# Patient Record
Sex: Female | Born: 2000 | Race: Black or African American | Hispanic: No | Marital: Single | Smoking: Never smoker
Health system: Southern US, Community
[De-identification: ages and names within clinical notes are randomized; demographics above are authoritative.]

## PROBLEM LIST (undated history)

## (undated) DIAGNOSIS — K219 Gastro-esophageal reflux disease without esophagitis: Secondary | ICD-10-CM

## (undated) DIAGNOSIS — J45909 Unspecified asthma, uncomplicated: Secondary | ICD-10-CM

## (undated) DIAGNOSIS — T7840XA Allergy, unspecified, initial encounter: Secondary | ICD-10-CM

## (undated) HISTORY — DX: Gastro-esophageal reflux disease without esophagitis: K21.9

## (undated) HISTORY — DX: Allergy, unspecified, initial encounter: T78.40XA

## (undated) HISTORY — PX: WISDOM TOOTH EXTRACTION: SHX21

---
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
## 2021-01-05 ENCOUNTER — Other Ambulatory Visit: Payer: No Typology Code available for payment source

## 2021-01-10 ENCOUNTER — Other Ambulatory Visit: Payer: Self-pay

## 2021-01-10 ENCOUNTER — Encounter: Payer: Self-pay | Admitting: Cardiology

## 2021-01-10 ENCOUNTER — Ambulatory Visit (INDEPENDENT_AMBULATORY_CARE_PROVIDER_SITE_OTHER): Payer: No Typology Code available for payment source | Admitting: Cardiology

## 2021-01-10 ENCOUNTER — Ambulatory Visit
Admission: RE | Admit: 2021-01-10 | Discharge: 2021-01-10 | Disposition: A | Discharge status: Home / Self Care | Payer: No Typology Code available for payment source | Source: Ambulatory Visit | Attending: Cardiology | Admitting: Cardiology

## 2021-01-10 VITALS — BP 110/70 | HR 75 | Ht 68.0 in | Wt 243.0 lb

## 2021-01-10 DIAGNOSIS — R002 Palpitations: Secondary | ICD-10-CM

## 2021-01-10 DIAGNOSIS — R0602 Shortness of breath: Secondary | ICD-10-CM

## 2021-01-10 LAB — ECHOCARDIOGRAM COMPLETE
AR max vel: 2 cm2
AV Area VTI: 2.26 cm2
AV Area mean vel: 2 cm2
AV Mean grad: 4 mmHg
AV Peak grad: 7.4 mmHg
Ao pk vel: 1.36 m/s
Area-P 1/2: 4.52 cm2
MV VTI: 2.81 cm2
S' Lateral: 2.2 cm

## 2021-01-10 NOTE — Progress Notes (Signed)
Cardiology Office Note:    Date:  01/10/2021   ID:  Valerie Martinez, DOB 2000/05/19, MRN [id number]  PCP:  Smitty Cords, DO   [facility] Providers Cardiologist:  None     Referring MD: Saralyn Pilar *   Chief Complaint  Patient presents with   OTher    Follow up post ECHO -- Meds reviewed verbally with patient.     History of Present Illness:    Valerie Martinez is a 20 y.o. female with a hx of exercise-induced asthma who presents for follow-up.  Previously seen due to palpitations and shortness of breath.    Shortness of breath usually occurs with exertion.  She also has palpitations typically when she is at school.  She is currently on break, states overall symptoms are better.  She thinks when she graduates from college, her symptoms will be much better.  Has no new concerns at this time, denies chest pain.   Past Medical History:  Diagnosis Date   Allergy    Asthma     Past Surgical History:  Procedure Laterality Date   WISDOM TOOTH EXTRACTION      Current Medications: Current Meds  Medication Sig   albuterol (VENTOLIN HFA) 108 (90 Base) MCG/ACT inhaler 2 puffs Inhalation q 4-6 hours prn cough/wheeze 90 days   Albuterol Sulfate (PROAIR RESPICLICK) 108 (90 Base) MCG/ACT AEPB Inhale 1-2 puffs into the lungs as needed (wheezing, shortness of breath).   cyclobenzaprine (FLEXERIL) 5 MG tablet TAKE 1 TABLET BY MOUTH NIGHTLY AS NEEDED   EPINEPHrine 0.3 mg/0.3 mL IJ SOAJ injection use one auto-injector into outer thigh as needed for  anaphylaxis   fluticasone (FLONASE) 50 MCG/ACT nasal spray Place 2 sprays into both nostrils daily.   fluticasone furoate-vilanterol (BREO ELLIPTA) 100-25 MCG/INH AEPB 1 puff Inhalation Once a day 30 days   ibuprofen (ADVIL,MOTRIN) 800 MG tablet Take 1 tablet (800 mg total) by mouth every 8 (eight) hours as needed.   olopatadine (PATANOL) 0.1 % ophthalmic solution    Olopatadine HCl 0.2 % SOLN place one drop into  affected eye Twice a day   Polyethylene Glycol 3350 (PEG 3350) 17 GM/SCOOP POWD Use as directed, mix in 6-8oz of fluid daily for constipation   rizatriptan (MAXALT) 5 MG tablet Take 1 tablet (5 mg total) by mouth as needed for migraine. May repeat in 2 hours if needed     Allergies:   Peanut oil and Amoxicillin-pot clavulanate   Social History   Socioeconomic History   Marital status: Single    Spouse name: Not on file   Number of children: Not on file   Years of education: Not on file   Highest education level: Not on file  Occupational History   Not on file  Tobacco Use   Smoking status: Never   Smokeless tobacco: Never  Vaping Use   Vaping Use: Never used  Substance and Sexual Activity   Alcohol use: Never   Drug use: Never   Sexual activity: Never    Birth control/protection: None  Other Topics Concern   Not on file  Social History Narrative   Not on file   Social Determinants of Health   Financial Resource Strain: Not on file  Food Insecurity: Not on file  Transportation Needs: Not on file  Physical Activity: Not on file  Stress: Not on file  Social Connections: Not on file     Family History: The patient's family history includes Cancer in her maternal grandmother. There  is no history of Breast cancer, Ovarian cancer, or Colon cancer.  ROS:   Please see the history of present illness.     All other systems reviewed and are negative.  EKGs/Labs/Other Studies Reviewed:    The following studies were reviewed today:   EKG:  EKG not ordered today.   Recent Labs: 11/01/2020: ALT 11; BUN 13; Creat 0.68; Hemoglobin 13.7; Platelets 271; Potassium 4.2; Sodium 137  Recent Lipid Panel    Component Value Date/Time   CHOL 164 11/01/2020 0820   TRIG 33 11/01/2020 0820   HDL 73 11/01/2020 0820   CHOLHDL 2.2 11/01/2020 0820   LDLCALC 82 11/01/2020 0820     Risk Assessment/Calculations:          Physical Exam:    VS:  BP 110/70 (BP Location: Left Arm,  Patient Position: Sitting, Cuff Size: Large)    Pulse 75    Ht 5\' 8"  (1.727 m)    Wt 243 lb (110.2 kg)    SpO2 98%    BMI 36.95 kg/m     Wt Readings from Last 3 Encounters:  01/10/21 243 lb (110.2 kg) (>99 %, Z= 2.43)*  11/29/20 240 lb (108.9 kg) (>99 %, Z= 2.40)*  11/04/20 238 lb 3.2 oz (108 kg) (>99 %, Z= 2.38)*   * Growth percentiles are based on CDC (Girls, 2-20 Years) data.     GEN:  Well nourished, well developed in no acute distress HEENT: Normal NECK: No JVD; No carotid bruits LYMPHATICS: No lymphadenopathy CARDIAC: RRR, no murmurs, rubs, gallops RESPIRATORY:  Clear to auscultation without rales, wheezing or rhonchi  ABDOMEN: Soft, non-tender, non-distended MUSCULOSKELETAL:  No edema; No deformity  SKIN: Warm and dry NEUROLOGIC:  Alert and oriented x 3 PSYCHIATRIC:  Normal affect   ASSESSMENT:    1. Palpitations   2. Shortness of breath     PLAN:    In order of problems listed above:  Palpitations, cardiac monitor did not show any significant arrhythmias, patient triggered events associated with sinus rhythm.  Patient made aware of results, reassured. Shortness of breath.echocardiogram shows normal systolic function, normal diastolic function, EF 60 to 65%.  Symptoms of shortness of breath likely from deconditioning, obesity.    Follow-up as needed      Medication Adjustments/Labs and Tests Ordered: Current medicines are reviewed at length with the patient today.  Concerns regarding medicines are outlined above.  No orders of the defined types were placed in this encounter.   No orders of the defined types were placed in this encounter.    There are no Patient Instructions on file for this visit.   Signed, 11/06/20, MD  01/10/2021 4:26 PM    [facility]

## 2021-01-10 NOTE — Progress Notes (Signed)
*  PRELIMINARY RESULTS* Echocardiogram 2D Echocardiogram has been performed.  Cristela Blue 01/10/2021, 11:41 AM

## 2021-01-10 NOTE — Patient Instructions (Signed)
Medication Instructions:  ? ?Your physician recommends that you continue on your current medications as directed. Please refer to the Current Medication list given to you today. ? ?*If you need a refill on your cardiac medications before your next appointment, please call your pharmacy* ? ? ?Lab Work: ?None ordered ?If you have labs (blood work) drawn today and your tests are completely normal, you will receive your results only by: ?MyChart Message (if you have MyChart) OR ?A paper copy in the mail ?If you have any lab test that is abnormal or we need to change your treatment, we will call you to review the results. ? ? ?Testing/Procedures: ?None ordered ? ? ?Follow-Up: ?At [facility], you and your health needs are our priority.  As part of our continuing mission to provide you with exceptional heart care, we have created designated Provider Care Teams.  These Care Teams include your primary Cardiologist (physician) and Advanced Practice Providers (APPs -  Physician Assistants and Nurse Practitioners) who all work together to provide you with the care you need, when you need it. ? ?We recommend signing up for the patient portal called "MyChart".  Sign up information is provided on this After Visit Summary.  MyChart is used to connect with patients for Virtual Visits (Telemedicine).  Patients are able to view lab/test results, encounter notes, upcoming appointments, etc.  Non-urgent messages can be sent to your provider as well.   ?To learn more about what you can do with MyChart, go to [email or web address].   ? ?Your next appointment:   ?Follow up as needed  ? ?The format for your next appointment:   ?In Person ? ?Provider:   ?You may see Brian Agbor-Etang or one of the following Advanced Practice Providers on your designated Care Team:   ?Christopher Berge, NP ?Ryan Dunn, PA-C ?Cadence Furth, PA-C  ? ? ?Other Instructions ? ? ?

## 2021-01-18 ENCOUNTER — Other Ambulatory Visit: Payer: Self-pay

## 2021-01-18 ENCOUNTER — Encounter: Payer: Self-pay | Admitting: Family Medicine

## 2021-01-18 ENCOUNTER — Ambulatory Visit (INDEPENDENT_AMBULATORY_CARE_PROVIDER_SITE_OTHER): Payer: No Typology Code available for payment source | Admitting: Family Medicine

## 2021-01-18 VITALS — BP 121/59 | HR 81 | Ht 68.0 in | Wt 242.4 lb

## 2021-01-18 DIAGNOSIS — R002 Palpitations: Secondary | ICD-10-CM

## 2021-01-18 DIAGNOSIS — E669 Obesity, unspecified: Secondary | ICD-10-CM

## 2021-01-18 DIAGNOSIS — R635 Abnormal weight gain: Secondary | ICD-10-CM | POA: Diagnosis not present

## 2021-01-18 DIAGNOSIS — R7309 Other abnormal glucose: Secondary | ICD-10-CM | POA: Diagnosis not present

## 2021-01-18 NOTE — Progress Notes (Signed)
Subjective:    Patient ID: Valerie Martinez, female    DOB: [birth year], 20 y.o.   MRN: [id number]  Valerie Martinez is a 20 y.o. female presenting on 01/18/2021 for Obesity   Here with mother, Morrison Old  HPI  Obesity BMI >36 Goal to lose weight Tried dietary regimen to help weight Limited exercise due to issues with palpitations and heart concerns but she has recently completed visited with Cardiologist and had ECHO and testing. Mon-Fri similar schedule with 730 am and done by 11am. She will have difficulty with breakfast consistency and then will try to improve lunch and dinner consistency. She has tried a protein supplement drink but not pleased with it. She does more home cooked meals. She admits difficulty finding protein and some difficulty some non beef options. Still has access to nutritionist at school Will go back to school starting 01/31/21  Fam history Diabetes   Depression screen [facility] 2/9 01/18/2021 11/04/2020 08/31/2020  Decreased Interest 1 1 0  Down, Depressed, Hopeless 1 1 1   PHQ - 2 Score 2 2 1   Altered sleeping 2 3 2   Tired, decreased energy 1 2 1   Change in appetite 3 3 3   Feeling bad or failure about yourself  1 1 1   Trouble concentrating 0 0 1  Moving slowly or fidgety/restless 0 1 0  Suicidal thoughts 0 0 0  PHQ-9 Score 9 12 9   Difficult doing work/chores Very difficult Somewhat difficult Not difficult at all    Social History   Tobacco Use   Smoking status: Never   Smokeless tobacco: Never  Vaping Use   Vaping Use: Never used  Substance Use Topics   Alcohol use: Never   Drug use: Never    Review of Systems Per HPI unless specifically indicated above     Objective:    BP (!) 121/59    Pulse 81    Ht 5\' 8"  (1.727 m)    Wt 242 lb 6.4 oz (110 kg)    LMP 01/04/2021 (Exact Date)    SpO2 100%    BMI 36.86 kg/m   Wt Readings from Last 3 Encounters:  01/18/21 242 lb 6.4 oz (110 kg) (>99 %, Z= 2.42)*  01/10/21 243 lb (110.2 kg) (>99 %, Z= 2.43)*   11/29/20 240 lb (108.9 kg) (>99 %, Z= 2.40)*   * Growth percentiles are based on CDC (Girls, 2-20 Years) data.    Physical Exam Vitals and nursing note reviewed.  Constitutional:      General: She is not in acute distress.    Appearance: Normal appearance. She is well-developed. She is obese. She is not diaphoretic.     Comments: Well-appearing, comfortable, cooperative  HENT:     Head: Normocephalic and atraumatic.  Eyes:     General:        Right eye: No discharge.        Left eye: No discharge.     Conjunctiva/sclera: Conjunctivae normal.  Cardiovascular:     Rate and Rhythm: Normal rate.  Pulmonary:     Effort: Pulmonary effort is normal.  Skin:    General: Skin is warm and dry.     Findings: No erythema or rash.  Neurological:     Mental Status: She is alert and oriented to person, place, and time.  Psychiatric:        Mood and Affect: Mood normal.        Behavior: Behavior normal.        Thought Content:  Thought content normal.     Comments: Well groomed, good eye contact, normal speech and thoughts   Results for orders placed or performed during the hospital encounter of 01/10/21  ECHOCARDIOGRAM COMPLETE  Result Value Ref Range   Ao pk vel 1.36 m/s   AV Area VTI 2.26 cm2   AR max vel 2.00 cm2   AV Mean grad 4.0 mmHg   AV Peak grad 7.4 mmHg   S' Lateral 2.20 cm   AV Area mean vel 2.00 cm2   Area-P 1/2 4.52 cm2   MV VTI 2.81 cm2      Assessment & Plan:   Problem List Items Addressed This Visit   None Visit Diagnoses     Obesity (BMI 35.0-39.9 without comorbidity)    -  Primary   Relevant Orders   Hemoglobin A1c   TSH   T4, free   Abnormal weight gain       Relevant Orders   Hemoglobin A1c   TSH   T4, free   Abnormal glucose       Relevant Orders   Hemoglobin A1c   Intermittent palpitations       Relevant Orders   TSH   T4, free       Obesity BMI >36 Reviewed history with reassuring normal cardiac evaluation Focus on improving  deconditioning physically and improving nutrition to help promote wt loss See AVS for goal setting and diet / exercise regimen advice  Check labs within 1 week for TSH T4 and A1c, she has had CMET CBC Lipid done 2 months ago already, reviewed, slightly elevated glucose 101, otherwise normal cholesterol other labs normal.  If no other cause of wt gain obesity identified, will pursue further lifestyle goal intervention and gradual improve diet / exercise regimen.  Offered [facility] - she declines now, reconsider in future. Consider Nutritionist locally at [facility] lifestyle center as well. Or keep nutrition through her college.  Future if still unsuccessful we can pursue wt loss medication - advised about Bernie Covey and [facility] today. Would consider 3 month trial if indicated in future.  No orders of the defined types were placed in this encounter.   Follow up plan: Return in about 1 week (around 01/25/2021) for 1 week for fasting lab only then return 3-6 months for weight management.  Future labs ordered for within 1 week A1c TSH T4   Saralyn Pilar, DO [facility] 01/18/2021, 4:28 PM

## 2021-01-18 NOTE — Patient Instructions (Addendum)
Thank you for coming to the office today.  Goals: 1. Dietary - consistent breakfast, high protein, low sugar/carb. Can do protein bar, cereal type bar, or yogurt w/ granola 2. Switch potato from snack to hummus w/ veggies or popcorn or grain/wheat chips or pita. 3. Switch potato for vegetable 4. Limit Sodas while at college 5. Increase hydration in general more water 30-60 oz per day 6. Focus on more high protein plant based options as discussed  Week of __________ Goals SUN MON TUE WED THU FRI SAT                              Eat at least 3 meals and 1-2 snacks per day (don't skip breakfast).  Aim for no more than 5 hours between eating. - Tip: If you go >5 hours without eating and become very hungry, your body will supply it's own resources temporarily and you can gain extra weight when you eat.  The 5 Minute Rule of Exercise - Promise yourself to at least do 5 minutes of exercise (make sure you time it), and if at the end of 5 minutes (this is the hardest part of the work-out), if you still feel like you want stop (or not motivated to continue) then allow yourself to stop. Otherwise, more often than not you will feel encouraged that you can continue for a little while longer or even more!   Please schedule a Follow-up Appointment to: Return in about 1 week (around 01/25/2021) for 1 week for fasting lab only then return 3-6 months for weight management.  If you have any other questions or concerns, please feel free to call the office or send a message through MyChart. You may also schedule an earlier appointment if necessary.  Additionally, you may be receiving a survey about your experience at our office within a few days to 1 week by e-mail or mail. We value your feedback.  Saralyn Pilar, DO [facility], [place]

## 2021-01-19 ENCOUNTER — Other Ambulatory Visit (HOSPITAL_COMMUNITY)
Admission: RE | Admit: 2021-01-19 | Discharge: 2021-01-19 | Disposition: A | Discharge status: Home / Self Care | Payer: No Typology Code available for payment source | Source: Ambulatory Visit | Attending: Certified Nurse Midwife | Admitting: Certified Nurse Midwife

## 2021-01-19 ENCOUNTER — Other Ambulatory Visit: Payer: Self-pay

## 2021-01-19 ENCOUNTER — Ambulatory Visit (INDEPENDENT_AMBULATORY_CARE_PROVIDER_SITE_OTHER): Payer: No Typology Code available for payment source | Admitting: Certified Nurse Midwife

## 2021-01-19 ENCOUNTER — Encounter: Payer: Self-pay | Admitting: Certified Nurse Midwife

## 2021-01-19 VITALS — BP 139/79 | HR 84 | Ht 68.0 in | Wt 240.4 lb

## 2021-01-19 DIAGNOSIS — N898 Other specified noninflammatory disorders of vagina: Secondary | ICD-10-CM | POA: Diagnosis present

## 2021-01-19 DIAGNOSIS — R7309 Other abnormal glucose: Secondary | ICD-10-CM

## 2021-01-19 DIAGNOSIS — R635 Abnormal weight gain: Secondary | ICD-10-CM

## 2021-01-19 DIAGNOSIS — E669 Obesity, unspecified: Secondary | ICD-10-CM

## 2021-01-19 DIAGNOSIS — R002 Palpitations: Secondary | ICD-10-CM

## 2021-01-19 NOTE — Patient Instructions (Signed)

## 2021-01-19 NOTE — Progress Notes (Signed)
GYN ENCOUNTER NOTE  Subjective:       Valerie Martinez is a 20 y.o. G0P0000 female is here for gynecologic evaluation of the following issues:  1. Vaginal odor and yellow discharge x 1 wk. She denies itching, burning. States she has had Yeast infections in past. She denies history of any sexual active and possibility of STD.     Gynecologic History Patient's last menstrual period was 01/04/2021 (exact date). Contraception: none, not sexually active Last Pap: n/a.  Last mammogram: n/a.   Obstetric History OB History  Gravida Para Term Preterm AB Living  0 0 0 0 0 0  SAB IAB Ectopic Multiple Live Births  0 0 0 0 0    Past Medical History:  Diagnosis Date   Allergy    Asthma     Past Surgical History:  Procedure Laterality Date   WISDOM TOOTH EXTRACTION      Current Outpatient Medications on File Prior to Visit  Medication Sig Dispense Refill   albuterol (VENTOLIN HFA) 108 (90 Base) MCG/ACT inhaler 2 puffs Inhalation q 4-6 hours prn cough/wheeze 90 days 18 g 0   Albuterol Sulfate (PROAIR RESPICLICK) 123XX123 (90 Base) MCG/ACT AEPB Inhale 1-2 puffs into the lungs as needed (wheezing, shortness of breath). 1 each 5   cyclobenzaprine (FLEXERIL) 5 MG tablet TAKE 1 TABLET BY MOUTH NIGHTLY AS NEEDED 30 tablet 0   EPINEPHrine 0.3 mg/0.3 mL IJ SOAJ injection Inject into the muscle once.     EPINEPHrine 0.3 mg/0.3 mL IJ SOAJ injection use one auto-injector into outer thigh as needed for  anaphylaxis 4 each 1   fluticasone furoate-vilanterol (BREO ELLIPTA) 100-25 MCG/INH AEPB 1 puff Inhalation Once a day 30 days 60 each 5   ibuprofen (ADVIL,MOTRIN) 800 MG tablet Take 1 tablet (800 mg total) by mouth every 8 (eight) hours as needed. 12 tablet 0   olopatadine (PATANOL) 0.1 % ophthalmic solution      Olopatadine HCl 0.2 % SOLN place one drop into affected eye Twice a day 2.5 mL 5   Polyethylene Glycol 3350 (PEG 3350) 17 GM/SCOOP POWD Use as directed, mix in 6-8oz of fluid daily for constipation      rizatriptan (MAXALT) 5 MG tablet Take 1 tablet (5 mg total) by mouth as needed for migraine. May repeat in 2 hours if needed 10 tablet 2   fluticasone (FLONASE) 50 MCG/ACT nasal spray Place 2 sprays into both nostrils daily.     No current facility-administered medications on file prior to visit.    Allergies  Allergen Reactions   Peanut Oil Anaphylaxis   Amoxicillin-Pot Clavulanate Other (See Comments)    Social History   Socioeconomic History   Marital status: Single    Spouse name: Not on file   Number of children: Not on file   Years of education: Not on file   Highest education level: Not on file  Occupational History   Not on file  Tobacco Use   Smoking status: Never   Smokeless tobacco: Never  Vaping Use   Vaping Use: Never used  Substance and Sexual Activity   Alcohol use: Never   Drug use: Never   Sexual activity: Never    Birth control/protection: None  Other Topics Concern   Not on file  Social History Narrative   Not on file   Social Determinants of Health   Financial Resource Strain: Not on file  Food Insecurity: Not on file  Transportation Needs: Not on file  Physical Activity: Not  on file  Stress: Not on file  Social Connections: Not on file  Intimate Partner Violence: Not on file    Family History  Problem Relation Age of Onset   Cancer Maternal Grandmother    Breast cancer Neg Hx    Ovarian cancer Neg Hx    Colon cancer Neg Hx     The following portions of the patient's history were reviewed and updated as appropriate: allergies, current medications, past family history, past medical history, past social history, past surgical history and problem list.  Review of Systems Review of Systems - Negative except as mentioned in HPI Review of Systems - General ROS: negative for - chills, fatigue, fever, hot flashes, malaise or night sweats Hematological and Lymphatic ROS: negative for - bleeding problems or swollen lymph  nodes Gastrointestinal ROS: negative for - abdominal pain, blood in stools, change in bowel habits and nausea/vomiting Musculoskeletal ROS: negative for - joint pain, muscle pain or muscular weakness Genito-Urinary ROS: negative for - change in menstrual cycle, dysmenorrhea, dyspareunia, dysuria,, genital ulcers, hematuria, incontinence, irregular/heavy menses, nocturia or pelvic pain. Positive for vaginal discharge with odor.   Objective:   BP 139/79    Pulse 84    Ht 5\' 8"  (1.727 m)    Wt 240 lb 6.4 oz (109 kg)    LMP 01/04/2021 (Exact Date)    BMI 36.55 kg/m  CONSTITUTIONAL: Well-developed, well-nourished female in no acute distress.  HENT:  Normocephalic, atraumatic.  NECK: Normal range of motion, supple, no masses.  Normal thyroid.  SKIN: Skin is warm and dry. No rash noted. Not diaphoretic. No erythema. No pallor. NEUROLGIC: Alert and oriented to person, place, and time. PSYCHIATRIC: Normal mood and affect. Normal behavior. Normal judgment and thought content. CARDIOVASCULAR:Not Examined RESPIRATORY: Not Examined BREASTS: Not Examined ABDOMEN: Soft, non distended; Non tender.  No Organomegaly. PELVIC:pt not sexually active speculum exam not indicated, swab collected MUSCULOSKELETAL: Normal range of motion. No tenderness.  No cyanosis, clubbing, or edema.     Assessment:   1. Vaginal discharge     Plan:   Discussed possible causes , reviewed self help measures.  Vaginal swab collected. Sample boric acid given with instructions for use. Will follow up with results. Follow up PRN.   01/06/2021, CNM

## 2021-01-20 LAB — HEMOGLOBIN A1C
Hgb A1c MFr Bld: 4.9 % of total Hgb (ref <5.7)
Mean Plasma Glucose: 94 mg/dL
eAG (mmol/L): 5.2 mmol/L

## 2021-01-20 LAB — T4, FREE: Free T4: 1.1 ng/dL (ref 0.8–1.4)

## 2021-01-20 LAB — TSH: TSH: 0.85 mIU/L

## 2021-01-21 ENCOUNTER — Other Ambulatory Visit: Payer: Self-pay

## 2021-01-21 ENCOUNTER — Encounter: Payer: Self-pay | Admitting: Certified Nurse Midwife

## 2021-01-21 ENCOUNTER — Other Ambulatory Visit: Payer: Self-pay | Admitting: Certified Nurse Midwife

## 2021-01-21 LAB — CERVICOVAGINAL ANCILLARY ONLY
Bacterial Vaginitis (gardnerella): NEGATIVE
Candida Glabrata: POSITIVE — AB
Candida Vaginitis: NEGATIVE
Chlamydia: NEGATIVE
Comment: NEGATIVE
Comment: NEGATIVE
Comment: NEGATIVE
Comment: NEGATIVE
Comment: NEGATIVE
Comment: NORMAL
Neisseria Gonorrhea: NEGATIVE
Trichomonas: NEGATIVE

## 2021-01-21 MED ORDER — FLUCONAZOLE 150 MG PO TABS
150.0000 mg | ORAL_TABLET | Freq: Every day | ORAL | 0 refills | Status: DC
  Filled 2021-01-21: qty 1, 1d supply, fill #0

## 2021-01-24 ENCOUNTER — Other Ambulatory Visit: Payer: Self-pay

## 2021-02-21 ENCOUNTER — Other Ambulatory Visit: Payer: Self-pay

## 2021-02-27 ENCOUNTER — Encounter: Payer: Self-pay | Admitting: Family Medicine

## 2021-03-11 ENCOUNTER — Ambulatory Visit (INDEPENDENT_AMBULATORY_CARE_PROVIDER_SITE_OTHER): Payer: No Typology Code available for payment source | Admitting: Family Medicine

## 2021-03-11 ENCOUNTER — Encounter: Payer: Self-pay | Admitting: Family Medicine

## 2021-03-11 ENCOUNTER — Other Ambulatory Visit: Payer: Self-pay

## 2021-03-11 VITALS — BP 119/68 | HR 80 | Ht 69.0 in | Wt 242.8 lb

## 2021-03-11 DIAGNOSIS — K5900 Constipation, unspecified: Secondary | ICD-10-CM

## 2021-03-11 DIAGNOSIS — K582 Mixed irritable bowel syndrome: Secondary | ICD-10-CM | POA: Diagnosis not present

## 2021-03-11 DIAGNOSIS — K599 Functional intestinal disorder, unspecified: Secondary | ICD-10-CM

## 2021-03-11 NOTE — Progress Notes (Signed)
Subjective:    Patient ID: Valerie Martinez, female    DOB: [birth year], 21 y.o.   MRN: [id number]  Valerie Martinez is a 21 y.o. female presenting on 03/11/2021 for Abdominal Pain   HPI  Abdominal Pain, functional Constipation Difficulty to determine the pattern with frequency. Seems to have variable pain in different locations can be RUQ pain and then lower and also Left lower. Also has R sided back symptoms, worse with deep breathing and some movements. Not linked to larger meals, or fried food or greasy things. She had significant digestive issue with eating red meat, describes diarrhea episode below. Admits feeling nausea and weak Admits x 1 episode of BRBPR small amount Admits some BM every other day, sometimes with harder or dry straining BM. Admits occasional episode of diarrhea with warmth and flushing, usually only at night, feels drained.   Depression screen [facility] 2/9 03/11/2021 01/18/2021 11/04/2020  Decreased Interest 3 1 1   Down, Depressed, Hopeless 3 1 1   PHQ - 2 Score 6 2 2   Altered sleeping 3 2 3   Tired, decreased energy 3 1 2   Change in appetite 3 3 3   Feeling bad or failure about yourself  3 1 1   Trouble concentrating 2 0 0  Moving slowly or fidgety/restless 1 0 1  Suicidal thoughts 0 0 0  PHQ-9 Score 21 9 12   Difficult doing work/chores Extremely dIfficult Very difficult Somewhat difficult    Social History   Tobacco Use   Smoking status: Never   Smokeless tobacco: Never  Vaping Use   Vaping Use: Never used  Substance Use Topics   Alcohol use: Never   Drug use: Never    Review of Systems Per HPI unless specifically indicated above     Objective:    BP 119/68    Pulse 80    Ht 5\' 9"  (1.753 m)    Wt 242 lb 12.8 oz (110.1 kg)    SpO2 100%    BMI 35.86 kg/m   Wt Readings from Last 3 Encounters:  03/11/21 242 lb 12.8 oz (110.1 kg) (>99 %, Z= 2.43)*  01/19/21 240 lb 6.4 oz (109 kg) (>99 %, Z= 2.41)*  01/18/21 242 lb 6.4 oz (110 kg) (>99 %, Z= 2.42)*    * Growth percentiles are based on CDC (Girls, 2-20 Years) data.    Physical Exam Vitals and nursing note reviewed.  Constitutional:      General: She is not in acute distress.    Appearance: Normal appearance. She is well-developed. She is not diaphoretic.     Comments: Well-appearing, comfortable, cooperative  HENT:     Head: Normocephalic and atraumatic.  Eyes:     General:        Right eye: No discharge.        Left eye: No discharge.     Conjunctiva/sclera: Conjunctivae normal.  Cardiovascular:     Rate and Rhythm: Normal rate.  Pulmonary:     Effort: Pulmonary effort is normal. No respiratory distress.     Breath sounds: No wheezing, rhonchi or rales.     Comments: Good air movement, no issues with breathing causing pain. Abdominal:     General: Bowel sounds are decreased. There is no distension.     Palpations: Abdomen is soft.     Tenderness: There is no abdominal tenderness. There is no guarding or rebound. Negative signs include Murphy's sign.     Comments: Localized area but non tender now, RUQ under rib cage and  LUQ occasionally not today  Skin:    General: Skin is warm and dry.     Findings: No erythema or rash.  Neurological:     Mental Status: She is alert and oriented to person, place, and time.  Psychiatric:        Mood and Affect: Mood normal.        Behavior: Behavior normal.        Thought Content: Thought content normal.     Comments: Well groomed, good eye contact, normal speech and thoughts   Results for orders placed or performed in visit on 01/19/21  Cervicovaginal ancillary only  Result Value Ref Range   Neisseria Gonorrhea Negative    Chlamydia Negative    Trichomonas Negative    Bacterial Vaginitis (gardnerella) Negative    Candida Vaginitis Negative    Candida Glabrata Positive (A)    Comment      Normal Reference Range Bacterial Vaginosis - Negative   Comment Normal Reference Range Candida Species - Negative    Comment Normal Reference  Range Candida Galbrata - Negative    Comment Normal Reference Range Trichomonas - Negative    Comment Normal Reference Ranger Chlamydia - Negative    Comment      Normal Reference Range Neisseria Gonorrhea - Negative      Assessment & Plan:   Problem List Items Addressed This Visit   None Visit Diagnoses     Constipation, unspecified constipation type    -  Primary   Irritable bowel syndrome with both constipation and diarrhea       Functional bowel disease           Clinically with some constellation of functional GI symptoms with mixed IBS constipation primary symptoms, some episodic diarrhea / loose stool seems to be provoked triggered by diet - seems red meat and other potential food triggers for her.  X 1 episode small amount BRBPR, can be assoc w/ constipation  Has mixture of upper GI symptoms assoc with these bowel changes  Not endorsing significant persistent abdominal pain or other high risk GI features  Symptoms not consistent with RUQ Biliary colick. No urinary symptoms Not triggered by meals such as GERD or PUD, less likely GB  Trial on miralax course to resolve or maintain the constipation, and hopefully some functional Gi will improve. Counseling on improving diet avoid triggers, limit red meat  May consider Dicyclomine PRN, she declines today.  Future consider GI consultation if unresolved.   No orders of the defined types were placed in this encounter.     Follow up plan: Return if symptoms worsen or fail to improve.   Saralyn Pilar, DO [facility] 03/11/2021, 11:12 AM

## 2021-03-11 NOTE — Patient Instructions (Addendum)
Thank you for coming to the office today.  Likely a functional digestive problem with certain food triggers especially red meat. So try to avoid and eliminate certain food triggers for now. To see if helps  For Constipation (less frequent bowel movement that can be hard dry or involve straining).  Recommend trying OTC Miralax 17g = 1 capful in large glass water once daily for now, try several days to see if working, goal is soft stool or BM 1-2 times daily, if too loose then reduce dose or try every other day. If not effective may need to increase it to 2 doses at once in AM or may do 1 in morning and 1 in afternoon/evening  - This medicine is very safe and can be used often without any problem and will not make you dehydrated. It is good for use on AS NEEDED BASIS or even MAINTENANCE therapy for longer term for several days to weeks at a time to help regulate bowel movements  Other more natural remedies or preventative treatment: - Increase hydration with water - Increase fiber in diet (high fiber foods = vegetables, leafy greens, oats/grains) - May take OTC Fiber supplement (metamucil powder or pill/gummy) - May try OTC Probiotic  If not improving would recommend consultation with GI specialist in future.   Please schedule a Follow-up Appointment to: Return if symptoms worsen or fail to improve.  If you have any other questions or concerns, please feel free to call the office or send a message through MyChart. You may also schedule an earlier appointment if necessary.  Additionally, you may be receiving a survey about your experience at our office within a few days to 1 week by e-mail or mail. We value your feedback.  Saralyn Pilar, DO [facility], [place]

## 2021-03-14 ENCOUNTER — Other Ambulatory Visit: Payer: Self-pay

## 2021-05-11 ENCOUNTER — Encounter: Payer: Self-pay | Admitting: Family Medicine

## 2021-07-15 ENCOUNTER — Ambulatory Visit (INDEPENDENT_AMBULATORY_CARE_PROVIDER_SITE_OTHER): Payer: No Typology Code available for payment source | Admitting: Internal Medicine

## 2021-07-15 ENCOUNTER — Other Ambulatory Visit (HOSPITAL_COMMUNITY)
Admission: RE | Admit: 2021-07-15 | Discharge: 2021-07-15 | Disposition: A | Discharge status: Home / Self Care | Payer: No Typology Code available for payment source | Source: Ambulatory Visit | Attending: Internal Medicine | Admitting: Internal Medicine

## 2021-07-15 ENCOUNTER — Encounter: Payer: Self-pay | Admitting: Internal Medicine

## 2021-07-15 VITALS — BP 136/70 | HR 80 | Temp 96.9°F | Wt 257.0 lb

## 2021-07-15 DIAGNOSIS — R350 Frequency of micturition: Secondary | ICD-10-CM

## 2021-07-15 DIAGNOSIS — R102 Pelvic and perineal pain: Secondary | ICD-10-CM | POA: Insufficient documentation

## 2021-07-15 DIAGNOSIS — N898 Other specified noninflammatory disorders of vagina: Secondary | ICD-10-CM

## 2021-07-15 LAB — POCT URINALYSIS DIPSTICK
Bilirubin, UA: NEGATIVE
Glucose, UA: NEGATIVE
Ketones, UA: NEGATIVE
Leukocytes, UA: NEGATIVE
Nitrite, UA: NEGATIVE
Protein, UA: NEGATIVE
Spec Grav, UA: 1.01 (ref 1.010–1.025)
Urobilinogen, UA: 0.2 E.U./dL
pH, UA: 8 (ref 5.0–8.0)

## 2021-07-17 LAB — URINE CULTURE
MICRO NUMBER:: 13569195
Result:: NO GROWTH
SPECIMEN QUALITY:: ADEQUATE

## 2021-07-19 LAB — CERVICOVAGINAL ANCILLARY ONLY
Bacterial Vaginitis (gardnerella): NEGATIVE
Candida Glabrata: NEGATIVE
Candida Vaginitis: NEGATIVE
Chlamydia: NEGATIVE
Comment: NEGATIVE
Comment: NEGATIVE
Comment: NEGATIVE
Comment: NEGATIVE
Comment: NEGATIVE
Comment: NORMAL
Neisseria Gonorrhea: NEGATIVE
Trichomonas: NEGATIVE

## 2021-08-02 ENCOUNTER — Ambulatory Visit (INDEPENDENT_AMBULATORY_CARE_PROVIDER_SITE_OTHER): Payer: No Typology Code available for payment source | Admitting: Certified Nurse Midwife

## 2021-08-02 VITALS — BP 134/83 | HR 90 | Ht 68.0 in | Wt 255.3 lb

## 2021-08-02 DIAGNOSIS — N644 Mastodynia: Secondary | ICD-10-CM | POA: Diagnosis not present

## 2021-08-02 DIAGNOSIS — E669 Obesity, unspecified: Secondary | ICD-10-CM | POA: Insufficient documentation

## 2021-08-02 NOTE — Progress Notes (Signed)
GYN ENCOUNTER NOTE  Subjective:       Valerie Martinez is a 21 y.o. G0P0000 female is here for gynecologic evaluation of the following issues:  1. Left sided breast pain x 3 wk. She describes it as sharp. 4-5/10 on pain scale , that mostly occurs when she is not wearing a bra. State she takes her bra off at night after she showers. She denies feeling any masses or leaking discharge from nipples. She denies trying anything at home to help.    States history breast cancer maternal grandmother.   Gynecologic History No LMP recorded. Contraception: none Last Pap: n/a .  Last mammogram: n/a  Obstetric History OB History  Gravida Para Term Preterm AB Living  0 0 0 0 0 0  SAB IAB Ectopic Multiple Live Births  0 0 0 0 0    Past Medical History:  Diagnosis Date   Allergy    Asthma     Past Surgical History:  Procedure Laterality Date   WISDOM TOOTH EXTRACTION      Current Outpatient Medications on File Prior to Visit  Medication Sig Dispense Refill   albuterol (VENTOLIN HFA) 108 (90 Base) MCG/ACT inhaler 2 puffs Inhalation q 4-6 hours prn cough/wheeze 90 days 18 g 0   Albuterol Sulfate (PROAIR RESPICLICK) 108 (90 Base) MCG/ACT AEPB Inhale 1-2 puffs into the lungs as needed (wheezing, shortness of breath). 1 each 5   EPINEPHrine 0.3 mg/0.3 mL IJ SOAJ injection use one auto-injector into outer thigh as needed for  anaphylaxis 4 each 1   fluticasone furoate-vilanterol (BREO ELLIPTA) 100-25 MCG/INH AEPB 1 puff Inhalation Once a day 30 days 60 each 5   ibuprofen (ADVIL,MOTRIN) 800 MG tablet Take 1 tablet (800 mg total) by mouth every 8 (eight) hours as needed. 12 tablet 0   Olopatadine HCl 0.2 % SOLN place one drop into affected eye Twice a day 2.5 mL 5   Polyethylene Glycol 3350 (PEG 3350) 17 GM/SCOOP POWD Use as directed, mix in 6-8oz of fluid daily for constipation     rizatriptan (MAXALT) 5 MG tablet Take 1 tablet (5 mg total) by mouth as needed for migraine. May repeat in 2 hours if  needed 10 tablet 2   fluticasone (FLONASE) 50 MCG/ACT nasal spray Place 2 sprays into both nostrils daily.     No current facility-administered medications on file prior to visit.    Allergies  Allergen Reactions   Peanut Oil Anaphylaxis   Amoxicillin-Pot Clavulanate Other (See Comments)    Social History   Socioeconomic History   Marital status: Single    Spouse name: Not on file   Number of children: Not on file   Years of education: Not on file   Highest education level: Not on file  Occupational History   Not on file  Tobacco Use   Smoking status: Never   Smokeless tobacco: Never  Vaping Use   Vaping Use: Never used  Substance and Sexual Activity   Alcohol use: Never   Drug use: Never   Sexual activity: Never    Birth control/protection: None  Other Topics Concern   Not on file  Social History Narrative   Not on file   Social Determinants of Health   Financial Resource Strain: Not on file  Food Insecurity: Not on file  Transportation Needs: Not on file  Physical Activity: Not on file  Stress: Not on file  Social Connections: Not on file  Intimate Partner Violence: Not on file  Family History  Problem Relation Age of Onset   Cancer Maternal Grandmother    Breast cancer Neg Hx    Ovarian cancer Neg Hx    Colon cancer Neg Hx     The following portions of the patient's history were reviewed and updated as appropriate: allergies, current medications, past family history, past medical history, past social history, past surgical history and problem list.  Review of Systems Review of Systems - Negative except as mentioned in HPI Review of Systems - General ROS: negative for - chills, fatigue, fever, hot flashes, malaise or night sweats Hematological and Lymphatic ROS: negative for - bleeding problems or swollen lymph nodes Gastrointestinal ROS: negative for - abdominal pain, blood in stools, change in bowel habits and nausea/vomiting Musculoskeletal ROS:  negative for - joint pain, muscle pain or muscular weakness Genito-Urinary ROS: negative for - change in menstrual cycle, dysmenorrhea, dyspareunia, dysuria, genital discharge, genital ulcers, hematuria, incontinence, irregular/heavy menses, nocturia or pelvic painjj  Objective:   BP 134/83   Pulse 90   Ht 5\' 8"  (1.727 m)   Wt 255 lb 4.8 oz (115.8 kg)   BMI 38.82 kg/m  CONSTITUTIONAL: Well-developed, well-nourished female in no acute distress.  HENT:  Normocephalic, atraumatic.  NECK: Normal range of motion, supple, no masses.  Normal thyroid.  SKIN: Skin is warm and dry. No rash noted. Not diaphoretic. No erythema. No pallor. NEUROLGIC: Alert and oriented to person, place, and time. PSYCHIATRIC: Normal mood and affect. Normal behavior. Normal judgment and thought content. CARDIOVASCULAR:Not Examined RESPIRATORY: Not Examined BREASTS: large pendulous breast.Breasts: breasts appear normal, no suspicious masses, no skin or nipple changes or axillary nodes. ABDOMEN: Soft, non distended; Non tender.  No Organomegaly. PELVIC: not necessary MUSCULOSKELETAL: Normal range of motion. No tenderness.  No cyanosis, clubbing, or edema.     Assessment:   1. Breast pain      Plan:   Reassurance given, no masses on exam . Discussed self help measures, use of tylenol/ibuprofen, dietary changes, nutritional supplements to assist . Encourage good supportive bra and use of sleep bra to help. She verbalizes and agrees to plan of care.   , CNM

## 2021-08-29 ENCOUNTER — Other Ambulatory Visit: Payer: Self-pay | Admitting: Family Medicine

## 2021-08-29 DIAGNOSIS — R635 Abnormal weight gain: Secondary | ICD-10-CM

## 2021-08-29 DIAGNOSIS — Z0000 Encounter for general adult medical examination without abnormal findings: Secondary | ICD-10-CM

## 2021-08-29 DIAGNOSIS — R7309 Other abnormal glucose: Secondary | ICD-10-CM

## 2021-08-29 DIAGNOSIS — E669 Obesity, unspecified: Secondary | ICD-10-CM

## 2021-10-25 ENCOUNTER — Ambulatory Visit (INDEPENDENT_AMBULATORY_CARE_PROVIDER_SITE_OTHER): Payer: No Typology Code available for payment source | Admitting: Internal Medicine

## 2021-10-25 ENCOUNTER — Encounter: Payer: Self-pay | Admitting: Internal Medicine

## 2021-10-25 DIAGNOSIS — F32A Depression, unspecified: Secondary | ICD-10-CM | POA: Diagnosis not present

## 2021-10-25 DIAGNOSIS — F419 Anxiety disorder, unspecified: Secondary | ICD-10-CM | POA: Insufficient documentation

## 2021-10-25 NOTE — Assessment & Plan Note (Signed)
PHQ-9 score of 22, GAD-7 score of 8 Discussed trying additional medication however she would like to hold off at this time Encourage adequate rest, can use melatonin up to 10 mg nightly Encouraged exercise daily as this can naturally help increase endorphins Encourage proper diet Advised her to keep her appointment with her therapist.  Given that this is very situational for her, she may be able to work through this with therapy over time. Support offered

## 2021-10-25 NOTE — Progress Notes (Signed)
Subjective:    Patient ID: Valerie Martinez, female    DOB: 03-Jan-2001, 21 y.o.   MRN: [id number]  HPI  Patient presents to clinic today with complaint of "not feeling myself lately". She reports this started 2 months ago. She reports she recently left her university and had to drop out to go to [employer]. She had to also cut back to part time. Her mother is also currently going through a divorce. She lives at home with her mom but she did not have to move. She reports they are having financial issues but she is not currently working. She is having trouble falling asleep, poor appetite, weight gain and lack of motivation. She has dealt with anxiety and depression in the past.  She reports she was prescribed Buspirone and Escitalopram in the past but did not take this long because she did not like the way it made her feel.  She does have an appointment with a therapist on October 13.  Review of Systems     Past Medical History:  Diagnosis Date   Allergy    Asthma     Current Outpatient Medications  Medication Sig Dispense Refill   albuterol (VENTOLIN HFA) 108 (90 Base) MCG/ACT inhaler 2 puffs Inhalation q 4-6 hours prn cough/wheeze 90 days 18 g 0   Albuterol Sulfate (PROAIR RESPICLICK) 123XX123 (90 Base) MCG/ACT AEPB Inhale 1-2 puffs into the lungs as needed (wheezing, shortness of breath). 1 each 5   EPINEPHrine 0.3 mg/0.3 mL IJ SOAJ injection use one auto-injector into outer thigh as needed for  anaphylaxis 4 each 1   fluticasone (FLONASE) 50 MCG/ACT nasal spray Place 2 sprays into both nostrils daily.     fluticasone furoate-vilanterol (BREO ELLIPTA) 100-25 MCG/INH AEPB 1 puff Inhalation Once a day 30 days 60 each 5   ibuprofen (ADVIL,MOTRIN) 800 MG tablet Take 1 tablet (800 mg total) by mouth every 8 (eight) hours as needed. 12 tablet 0   Olopatadine HCl 0.2 % SOLN place one drop into affected eye Twice a day 2.5 mL 5   Polyethylene Glycol 3350 (PEG 3350) 17 GM/SCOOP POWD Use as  directed, mix in 6-8oz of fluid daily for constipation     rizatriptan (MAXALT) 5 MG tablet Take 1 tablet (5 mg total) by mouth as needed for migraine. May repeat in 2 hours if needed 10 tablet 2   No current facility-administered medications for this visit.    Allergies  Allergen Reactions   Peanut Oil Anaphylaxis   Amoxicillin-Pot Clavulanate Other (See Comments)    Family History  Problem Relation Age of Onset   Cancer Maternal Grandmother    Breast cancer Neg Hx    Ovarian cancer Neg Hx    Colon cancer Neg Hx     Social History   Socioeconomic History   Marital status: Single    Spouse name: Not on file   Number of children: Not on file   Years of education: Not on file   Highest education level: Not on file  Occupational History   Not on file  Tobacco Use   Smoking status: Never   Smokeless tobacco: Never  Vaping Use   Vaping Use: Never used  Substance and Sexual Activity   Alcohol use: Never   Drug use: Never   Sexual activity: Never    Birth control/protection: None  Other Topics Concern   Not on file  Social History Narrative   Not on file   Social Determinants of Health  Financial Resource Strain: Not on file  Food Insecurity: Not on file  Transportation Needs: Not on file  Physical Activity: Not on file  Stress: Not on file  Social Connections: Not on file  Intimate Partner Violence: Not on file     Constitutional: Patient reports fatigue and weight gain.  Denies fever, malaise,  headache.  HEENT: Denies eye pain, eye redness, ear pain, ringing in the ears, wax buildup, runny nose, nasal congestion, bloody nose, or sore throat. Respiratory: Denies difficulty breathing, shortness of breath, cough or sputum production.   Cardiovascular: Denies chest pain, chest tightness, palpitations or swelling in the hands or feet.  Gastrointestinal: Patient reports poor appetite.  Denies abdominal pain, bloating, constipation, diarrhea or blood in the stool.   GU: Denies urgency, frequency, pain with urination, burning sensation, blood in urine, odor or discharge. Musculoskeletal: Denies decrease in range of motion, difficulty with gait, muscle pain or joint pain and swelling.  Skin: Denies redness, rashes, lesions or ulcercations.  Neurological: Patient reports insomnia, lack of motivation.  Denies dizziness, difficulty with memory, difficulty with speech or problems with balance and coordination.  Psych: Patient reports anxiety and depression.  Denies SI/HI.  No other specific complaints in a complete review of systems (except as listed in HPI above).  Objective:   Physical Exam BP 122/72 (BP Location: Right Arm, Patient Position: Sitting, Cuff Size: Large)   Pulse 84   Temp (!) 96.9 F (36.1 C) (Temporal)   Wt 265 lb (120.2 kg)   SpO2 99%   BMI 40.29 kg/m   Wt Readings from Last 3 Encounters:  08/02/21 255 lb 4.8 oz (115.8 kg) (>99 %, Z= 2.56)*  07/15/21 257 lb (116.6 kg) (>99 %, Z= 2.57)*  03/11/21 242 lb 12.8 oz (110.1 kg) (>99 %, Z= 2.43)*   * Growth percentiles are based on CDC (Girls, 2-20 Years) data.    General: Appears her stated age, obese, in NAD. Cardiovascular: Normal rate and rhythm. Pulmonary/Chest: Normal effort and positive vesicular breath sounds. No respiratory distress. No wheezes, rales or ronchi noted.  Musculoskeletal: No difficulty with gait.  Neurological: Alert and oriented. Cranial nerves II-XII grossly intact. Coordination normal.  Psychiatric: Mood and affect mildly flat. Behavior is normal. Judgment and thought content normal.     BMET    Component Value Date/Time   NA 137 11/01/2020 0820   K 4.2 11/01/2020 0820   CL 103 11/01/2020 0820   CO2 27 11/01/2020 0820   GLUCOSE 101 (H) 11/01/2020 0820   BUN 13 11/01/2020 0820   CREATININE 0.68 11/01/2020 0820   CALCIUM 9.6 11/01/2020 0820    Lipid Panel     Component Value Date/Time   CHOL 164 11/01/2020 0820   TRIG 33 11/01/2020 0820   HDL  73 11/01/2020 0820   CHOLHDL 2.2 11/01/2020 0820   LDLCALC 82 11/01/2020 0820    CBC    Component Value Date/Time   WBC 6.2 11/01/2020 0820   RBC 4.50 11/01/2020 0820   HGB 13.7 11/01/2020 0820   HCT 40.3 11/01/2020 0820   PLT 271 11/01/2020 0820   MCV 89.6 11/01/2020 0820   MCH 30.4 11/01/2020 0820   MCHC 34.0 11/01/2020 0820   RDW 13.1 11/01/2020 0820   LYMPHSABS 2,530 11/01/2020 0820   EOSABS 161 11/01/2020 0820   BASOSABS 37 11/01/2020 0820    Hgb A1C Lab Results  Component Value Date   HGBA1C 4.9 01/19/2021  Assessment & Plan:     Webb Silversmith, NP

## 2021-10-25 NOTE — Patient Instructions (Signed)

## 2021-10-31 ENCOUNTER — Other Ambulatory Visit: Payer: Self-pay

## 2021-10-31 DIAGNOSIS — R7309 Other abnormal glucose: Secondary | ICD-10-CM

## 2021-10-31 DIAGNOSIS — R635 Abnormal weight gain: Secondary | ICD-10-CM

## 2021-10-31 DIAGNOSIS — E669 Obesity, unspecified: Secondary | ICD-10-CM

## 2021-10-31 DIAGNOSIS — Z0000 Encounter for general adult medical examination without abnormal findings: Secondary | ICD-10-CM

## 2021-11-01 ENCOUNTER — Other Ambulatory Visit: Payer: No Typology Code available for payment source

## 2021-11-03 ENCOUNTER — Other Ambulatory Visit: Payer: No Typology Code available for payment source

## 2021-11-04 LAB — CBC WITH DIFFERENTIAL/PLATELET
Absolute Monocytes: 473 cells/uL (ref 200–950)
Basophils Absolute: 51 cells/uL (ref 0–200)
Basophils Relative: 0.9 %
Eosinophils Absolute: 108 cells/uL (ref 15–500)
Eosinophils Relative: 1.9 %
HCT: 42.2 % (ref 35.0–45.0)
Hemoglobin: 14 g/dL (ref 11.7–15.5)
Lymphs Abs: 2765 cells/uL (ref 850–3900)
MCH: 29.4 pg (ref 27.0–33.0)
MCHC: 33.2 g/dL (ref 32.0–36.0)
MCV: 88.7 fL (ref 80.0–100.0)
MPV: 10.5 fL (ref 7.5–12.5)
Monocytes Relative: 8.3 %
Neutro Abs: 2303 cells/uL (ref 1500–7800)
Neutrophils Relative %: 40.4 %
Platelets: 283 10*3/uL (ref 140–400)
RBC: 4.76 10*6/uL (ref 3.80–5.10)
RDW: 13.1 % (ref 11.0–15.0)
Total Lymphocyte: 48.5 %
WBC: 5.7 10*3/uL (ref 3.8–10.8)

## 2021-11-04 LAB — COMPLETE METABOLIC PANEL WITH GFR
AG Ratio: 1.5 (calc) (ref 1.0–2.5)
ALT: 13 U/L (ref 6–29)
AST: 14 U/L (ref 10–30)
Albumin: 4.4 g/dL (ref 3.6–5.1)
Alkaline phosphatase (APISO): 55 U/L (ref 31–125)
BUN: 12 mg/dL (ref 7–25)
CO2: 26 mmol/L (ref 20–32)
Calcium: 9.3 mg/dL (ref 8.6–10.2)
Chloride: 104 mmol/L (ref 98–110)
Creat: 0.89 mg/dL (ref 0.50–0.96)
Globulin: 2.9 g/dL (calc) (ref 1.9–3.7)
Glucose, Bld: 94 mg/dL (ref 65–99)
Potassium: 4.3 mmol/L (ref 3.5–5.3)
Sodium: 139 mmol/L (ref 135–146)
Total Bilirubin: 0.3 mg/dL (ref 0.2–1.2)
Total Protein: 7.3 g/dL (ref 6.1–8.1)
eGFR: 95 mL/min/{1.73_m2} (ref >=60)

## 2021-11-04 LAB — LIPID PANEL
Cholesterol: 152 mg/dL (ref <200)
HDL: 59 mg/dL (ref >=50)
LDL Cholesterol (Calc): 81 mg/dL (calc)
Non-HDL Cholesterol (Calc): 93 mg/dL (calc) (ref <130)
Total CHOL/HDL Ratio: 2.6 (calc) (ref <5.0)
Triglycerides: 50 mg/dL (ref <150)

## 2021-11-04 LAB — HEMOGLOBIN A1C
Hgb A1c MFr Bld: 5.2 % of total Hgb (ref <5.7)
Mean Plasma Glucose: 103 mg/dL
eAG (mmol/L): 5.7 mmol/L

## 2021-11-04 LAB — TSH: TSH: 1.09 mIU/L

## 2021-11-08 ENCOUNTER — Other Ambulatory Visit: Payer: Self-pay

## 2021-11-08 ENCOUNTER — Encounter: Payer: Self-pay | Admitting: Family Medicine

## 2021-11-08 ENCOUNTER — Ambulatory Visit (INDEPENDENT_AMBULATORY_CARE_PROVIDER_SITE_OTHER): Payer: No Typology Code available for payment source | Admitting: Family Medicine

## 2021-11-08 ENCOUNTER — Other Ambulatory Visit: Payer: Self-pay | Admitting: Family Medicine

## 2021-11-08 VITALS — BP 128/84 | HR 76 | Ht 68.0 in | Wt 266.0 lb

## 2021-11-08 DIAGNOSIS — R7309 Other abnormal glucose: Secondary | ICD-10-CM

## 2021-11-08 DIAGNOSIS — Z23 Encounter for immunization: Secondary | ICD-10-CM | POA: Diagnosis not present

## 2021-11-08 DIAGNOSIS — J45990 Exercise induced bronchospasm: Secondary | ICD-10-CM | POA: Diagnosis not present

## 2021-11-08 DIAGNOSIS — J3089 Other allergic rhinitis: Secondary | ICD-10-CM

## 2021-11-08 DIAGNOSIS — Z0000 Encounter for general adult medical examination without abnormal findings: Secondary | ICD-10-CM | POA: Diagnosis not present

## 2021-11-08 DIAGNOSIS — E538 Deficiency of other specified B group vitamins: Secondary | ICD-10-CM

## 2021-11-08 DIAGNOSIS — E559 Vitamin D deficiency, unspecified: Secondary | ICD-10-CM

## 2021-11-08 DIAGNOSIS — Z87892 Personal history of anaphylaxis: Secondary | ICD-10-CM

## 2021-11-08 DIAGNOSIS — Z6841 Body Mass Index (BMI) 40.0 and over, adult: Secondary | ICD-10-CM

## 2021-11-08 MED ORDER — EPINEPHRINE 0.3 MG/0.3ML IJ SOAJ
INTRAMUSCULAR | 1 refills | Status: DC
  Filled 2021-11-08: qty 2, 30d supply, fill #0

## 2021-11-08 NOTE — Progress Notes (Signed)
Subjective:    Patient ID: Valerie Martinez, female    DOB: 2000/06/25, 21 y.o.   MRN: [id number]  Valerie Martinez is a 21 y.o. female presenting on 11/08/2021 for Annual Exam   HPI  Here for Annual Physical   Lab review today  Wellness / Lifestyle Today she reports overall improving lifestyle Still feels tired and reduced energy Eliminated red meat from diet - Reduced energy, not walking as much - Mental health impacting her, low energy - She has good plan to walk early in AM - Feels reduced energy - Does walk Tues - Thurs but wants to improve this  Seen here on 10/3 for acute visit, saw Regina 10/3, mental health, referred to therapist, saw once so far 10/13, she prefers no medications  Allergies Off other meds Needs new epi pen back up only emergency   History of PACs   Mild-moderate Exercise Induced Asthma Environmental Seasonal Asthma Followed by Dr Remus Blake - Asthma/Allergy Off inhalers Breo, has albuterol PRN On OTC allergy meds   Headaches, chronic History of tension headaches. Anxiety  Took Maxalt rarely but not regularly. Says did not help all day. Seems not a migraine headache more anxiety tension HAs    Health Maintenance: Due for Flu Shot, will receive today      10/25/2021   10:34 AM 03/11/2021   10:46 AM 01/18/2021    3:37 PM  Depression screen PHQ 2/9  Decreased Interest 3 3 1   Down, Depressed, Hopeless 3 3 1   PHQ - 2 Score 6 6 2   Altered sleeping 3 3 2   Tired, decreased energy 3 3 1   Change in appetite 3 3 3   Feeling bad or failure about yourself  2 3 1   Trouble concentrating 2 2 0  Moving slowly or fidgety/restless 0 1 0  Suicidal thoughts 0 0 0  PHQ-9 Score 19 21 9   Difficult doing work/chores Extremely dIfficult Extremely dIfficult Very difficult      10/25/2021   10:35 AM 03/11/2021   10:46 AM 01/18/2021    3:37 PM 11/04/2020   10:44 AM  GAD 7 : Generalized Anxiety Score  Nervous, Anxious, on Edge 0 2 2 1   Control/stop worrying  2 3 2 2   Worry too much - different things 2 2 2 2   Trouble relaxing 1 3 1 2   Restless 0 1 0 0  Easily annoyed or irritable 3 3 2 1   Afraid - awful might happen 0 1 0 0  Total GAD 7 Score 8 15 9 8   Anxiety Difficulty  Somewhat difficult Somewhat difficult Somewhat difficult      Past Medical History:  Diagnosis Date   Allergy    Asthma    Past Surgical History:  Procedure Laterality Date   WISDOM TOOTH EXTRACTION     Social History   Socioeconomic History   Marital status: Single    Spouse name: Not on file   Number of children: Not on file   Years of education: Not on file   Highest education level: Not on file  Occupational History   Not on file  Tobacco Use   Smoking status: Never   Smokeless tobacco: Never  Vaping Use   Vaping Use: Never used  Substance and Sexual Activity   Alcohol use: Never   Drug use: Never   Sexual activity: Never    Birth control/protection: None  Other Topics Concern   Not on file  Social History Narrative   Not on file  Social Determinants of Health   Financial Resource Strain: Not on file  Food Insecurity: Not on file  Transportation Needs: Not on file  Physical Activity: Not on file  Stress: Not on file  Social Connections: Not on file  Intimate Partner Violence: Not on file   Family History  Problem Relation Age of Onset   Cancer Maternal Grandmother    Breast cancer Neg Hx    Ovarian cancer Neg Hx    Colon cancer Neg Hx    Current Outpatient Medications on File Prior to Visit  Medication Sig   Albuterol Sulfate (PROAIR RESPICLICK) 962 (90 Base) MCG/ACT AEPB Inhale 1-2 puffs into the lungs as needed (wheezing, shortness of breath).   Polyethylene Glycol 3350 (PEG 3350) 17 GM/SCOOP POWD Use as directed, mix in 6-8oz of fluid daily for constipation   No current facility-administered medications on file prior to visit.    Review of Systems  Constitutional:  Negative for activity change, appetite change, chills,  diaphoresis, fatigue and fever.  HENT:  Negative for congestion and hearing loss.   Eyes:  Negative for visual disturbance.  Respiratory:  Negative for cough, chest tightness, shortness of breath and wheezing.   Cardiovascular:  Negative for chest pain, palpitations and leg swelling.  Gastrointestinal:  Negative for abdominal pain, constipation, diarrhea, nausea and vomiting.  Genitourinary:  Negative for dysuria, frequency and hematuria.  Musculoskeletal:  Negative for arthralgias and neck pain.  Skin:  Negative for rash.  Neurological:  Negative for dizziness, weakness, light-headedness, numbness and headaches.  Hematological:  Negative for adenopathy.  Psychiatric/Behavioral:  Negative for behavioral problems, dysphoric mood and sleep disturbance.    Per HPI unless specifically indicated above      Objective:    BP 128/84 (BP Location: Left Arm, Cuff Size: Normal)   Pulse 76   Ht 5' 8"  (1.727 m)   Wt 266 lb (120.7 kg)   SpO2 100%   BMI 40.45 kg/m   Wt Readings from Last 3 Encounters:  11/08/21 266 lb (120.7 kg)  10/25/21 265 lb (120.2 kg) (>99 %, Z= 2.64)*  08/02/21 255 lb 4.8 oz (115.8 kg) (>99 %, Z= 2.56)*   * Growth percentiles are based on CDC (Girls, 2-20 Years) data.    Physical Exam Vitals and nursing note reviewed.  Constitutional:      General: She is not in acute distress.    Appearance: She is well-developed. She is not diaphoretic.     Comments: Well-appearing, comfortable, cooperative  HENT:     Head: Normocephalic and atraumatic.  Eyes:     General:        Right eye: No discharge.        Left eye: No discharge.     Conjunctiva/sclera: Conjunctivae normal.     Pupils: Pupils are equal, round, and reactive to light.  Neck:     Thyroid: No thyromegaly.     Vascular: No carotid bruit.  Cardiovascular:     Rate and Rhythm: Normal rate and regular rhythm.     Pulses: Normal pulses.     Heart sounds: Normal heart sounds. No murmur heard. Pulmonary:      Effort: Pulmonary effort is normal. No respiratory distress.     Breath sounds: Normal breath sounds. No wheezing or rales.  Abdominal:     General: Bowel sounds are normal. There is no distension.     Palpations: Abdomen is soft. There is no mass.     Tenderness: There is no abdominal tenderness.  Musculoskeletal:        General: No tenderness. Normal range of motion.     Cervical back: Normal range of motion and neck supple.     Right lower leg: No edema.     Left lower leg: No edema.     Comments: Upper / Lower Extremities: - Normal muscle tone, strength bilateral upper extremities 5/5, lower extremities 5/5  Lymphadenopathy:     Cervical: No cervical adenopathy.  Skin:    General: Skin is warm and dry.     Findings: No erythema or rash.  Neurological:     Mental Status: She is alert and oriented to person, place, and time.     Comments: Distal sensation intact to light touch all extremities  Psychiatric:        Mood and Affect: Mood normal.        Behavior: Behavior normal.        Thought Content: Thought content normal.     Comments: Well groomed, good eye contact, normal speech and thoughts       Results for orders placed or performed in visit on 10/31/21  TSH  Result Value Ref Range   TSH 1.09 mIU/L  Hemoglobin A1c  Result Value Ref Range   Hgb A1c MFr Bld 5.2 <5.7 % of total Hgb   Mean Plasma Glucose 103 mg/dL   eAG (mmol/L) 5.7 mmol/L  Lipid panel  Result Value Ref Range   Cholesterol 152 <200 mg/dL   HDL 59 > OR = 50 mg/dL   Triglycerides 50 <150 mg/dL   LDL Cholesterol (Calc) 81 mg/dL (calc)   Total CHOL/HDL Ratio 2.6 <5.0 (calc)   Non-HDL Cholesterol (Calc) 93 <130 mg/dL (calc)  CBC with Differential/Platelet  Result Value Ref Range   WBC 5.7 3.8 - 10.8 Thousand/uL   RBC 4.76 3.80 - 5.10 Million/uL   Hemoglobin 14.0 11.7 - 15.5 g/dL   HCT 42.2 35.0 - 45.0 %   MCV 88.7 80.0 - 100.0 fL   MCH 29.4 27.0 - 33.0 pg   MCHC 33.2 32.0 - 36.0 g/dL   RDW 13.1  11.0 - 15.0 %   Platelets 283 140 - 400 Thousand/uL   MPV 10.5 7.5 - 12.5 fL   Neutro Abs 2,303 1,500 - 7,800 cells/uL   Lymphs Abs 2,765 850 - 3,900 cells/uL   Absolute Monocytes 473 200 - 950 cells/uL   Eosinophils Absolute 108 15 - 500 cells/uL   Basophils Absolute 51 0 - 200 cells/uL   Neutrophils Relative % 40.4 %   Total Lymphocyte 48.5 %   Monocytes Relative 8.3 %   Eosinophils Relative 1.9 %   Basophils Relative 0.9 %  COMPLETE METABOLIC PANEL WITH GFR  Result Value Ref Range   Glucose, Bld 94 65 - 99 mg/dL   BUN 12 7 - 25 mg/dL   Creat 0.89 0.50 - 0.96 mg/dL   eGFR 95 > OR = 60 mL/min/1.53m   BUN/Creatinine Ratio SEE NOTE: 6 - 22 (calc)   Sodium 139 135 - 146 mmol/L   Potassium 4.3 3.5 - 5.3 mmol/L   Chloride 104 98 - 110 mmol/L   CO2 26 20 - 32 mmol/L   Calcium 9.3 8.6 - 10.2 mg/dL   Total Protein 7.3 6.1 - 8.1 g/dL   Albumin 4.4 3.6 - 5.1 g/dL   Globulin 2.9 1.9 - 3.7 g/dL (calc)   AG Ratio 1.5 1.0 - 2.5 (calc)   Total Bilirubin 0.3 0.2 - 1.2 mg/dL  Alkaline phosphatase (APISO) 55 31 - 125 U/L   AST 14 10 - 30 U/L   ALT 13 6 - 29 U/L      Assessment & Plan:   Problem List Items Addressed This Visit     Environmental and seasonal allergies   Mild exercise-induced asthma   Morbid obesity with BMI of 40.0-44.9, adult ([place])   Other Visit Diagnoses     Annual physical exam    -  Primary   Needs flu shot       Relevant Orders   Flu Vaccine QUAD 17moIM (Fluarix, Fluzone & Alfiuria Quad PF)   History of anaphylaxis       Relevant Medications   EPINEPHrine 0.3 mg/0.3 mL IJ SOAJ injection       Updated Health Maintenance information Reviewed recent lab results with patient - all in normal range Encouraged improvement to lifestyle with diet and exercise Goal of weight loss Discussion on lifestyle management, she prefers to avoid medications Given mental health / anxiety mood and stressors are still significantly impacting her, again prefers to avoid med,  she will pursue CBT counseling with therapist. Encouraged this and advised we can follow up if needed. Seems mental health is main factor for her energy fatigue.  Renew EpiPen  Flu Shot today  Updated med rec, she is off most medications    Meds ordered this encounter  Medications   EPINEPHrine 0.3 mg/0.3 mL IJ SOAJ injection    Sig: use one auto-injector into outer thigh as needed for  anaphylaxis    Dispense:  2 each    Refill:  1     Follow up plan: Return in about 1 year (around 11/09/2022) for 1 year fasting lab only then 1 week later Annual Physical.  Future labs 10/2022  ANobie Putnam DTurnerGroup 11/08/2021, 9:05 AM

## 2021-11-08 NOTE — Patient Instructions (Addendum)
Thank you for coming to the office today.  Updated medication list and renewed the EpiPen  Goal to keep improving.   Goals: 1.  2.  3.   Week of __________ Goals SUN MON TUE WED THU FRI SAT                                DUE for FASTING BLOOD WORK (no food or drink after midnight before the lab appointment, only water or coffee without cream/sugar on the morning of)  SCHEDULE "Lab Only" visit in the morning at the clinic for lab draw in 1 YEAR  - Make sure Lab Only appointment is at about 1 week before your next appointment, so that results will be available  For Lab Results, once available within 2-3 days of blood draw, you can can log in to MyChart online to view your results and a brief explanation. Also, we can discuss results at next follow-up visit.   Please schedule a Follow-up Appointment to: Return in about 1 year (around 11/09/2022) for 1 year fasting lab only then 1 week later Annual Physical.  If you have any other questions or concerns, please feel free to call the office or send a message through [facility]. You may also schedule an earlier appointment if necessary.  Additionally, you may be receiving a survey about your experience at our office within a few days to 1 week by e-mail or mail. We value your feedback.  Nobie Putnam, DO [place]

## 2021-11-25 ENCOUNTER — Other Ambulatory Visit: Payer: Self-pay

## 2021-11-25 ENCOUNTER — Encounter: Payer: Self-pay | Admitting: Family Medicine

## 2021-11-25 ENCOUNTER — Ambulatory Visit (INDEPENDENT_AMBULATORY_CARE_PROVIDER_SITE_OTHER): Payer: No Typology Code available for payment source | Admitting: Family Medicine

## 2021-11-25 VITALS — BP 138/70 | HR 81 | Ht 68.0 in | Wt 266.0 lb

## 2021-11-25 DIAGNOSIS — M7989 Other specified soft tissue disorders: Secondary | ICD-10-CM | POA: Diagnosis not present

## 2021-11-25 DIAGNOSIS — M7122 Synovial cyst of popliteal space [Baker], left knee: Secondary | ICD-10-CM

## 2021-11-25 DIAGNOSIS — M25462 Effusion, left knee: Secondary | ICD-10-CM

## 2021-11-25 MED ORDER — FUROSEMIDE 20 MG PO TABS
20.0000 mg | ORAL_TABLET | Freq: Every day | ORAL | 0 refills | Status: DC | PRN
  Filled 2021-11-25: qty 30, 30d supply, fill #0

## 2021-11-25 NOTE — Patient Instructions (Addendum)
Thank you for coming to the office today.  Left Knee Swelling can be sporadic maybe due to old injury or can be possible mild knee sprain  Less likely meniscus  No X-ray today we can reconsider in future  - I recommend a Knee Wrap ACE Wrap or sleeve compression  Prefer to avoid brace but can use if painful  Recommend trial of Anti-inflammatory with Naproxen / Aleve OTC 220 or 250mg  tabs - take 1 to 2 with food and plenty of water TWICE a day with meal for 1-2 weeks, then scale back as needed Can add or switch to topical Voltaren up to 4 times a day instead if you prefer  - It is safe to take Tylenol Ext Str 500mg  tabs - take 1 to 2 (max dose 1000mg ) every 6 hours as needed for breakthrough pain, max 24 hour daily dose is 6 to 8 tablets or 4000mg   Take Lasix (furosemide) 20mg  daily for 1-3 days for fluid pill to reduce swelling - only use very short term  Use RICE therapy: - R - Rest / relative rest with activity modification avoid overuse and frequent bending or pressure on bent knee - I - Ice packs (make sure you use a towel or sock / something to protect skin) - C - Compression with ACE wrap - E - Elevation - if significant swelling, lift leg above heart level (toes above your nose) to help reduce swelling, most helpful at night after day of being on your feet  If not improving within 2-4 week I would recommend referral to orthopedics for 2nd opinion, likely x-ray   Please schedule a Follow-up Appointment to: Return in about 4 weeks (around 12/23/2021), or if symptoms worsen or fail to improve, for 2-4 week follow up knee pain swelling if not improved.  If you have any other questions or concerns, please feel free to call the office or send a message through [place]. You may also schedule an earlier appointment if necessary.  Additionally, you may be receiving a survey about your experience at our office within a few days to 1 week by e-mail or mail. We value your  feedback.  Nobie Putnam, DO [place]
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
## 2021-11-28 ENCOUNTER — Encounter: Payer: Self-pay | Admitting: Family Medicine

## 2021-11-28 DIAGNOSIS — M7122 Synovial cyst of popliteal space [Baker], left knee: Secondary | ICD-10-CM

## 2021-11-28 DIAGNOSIS — M25462 Effusion, left knee: Secondary | ICD-10-CM

## 2021-12-01 ENCOUNTER — Other Ambulatory Visit: Payer: Self-pay

## 2021-12-01 ENCOUNTER — Ambulatory Visit (INDEPENDENT_AMBULATORY_CARE_PROVIDER_SITE_OTHER): Payer: No Typology Code available for payment source | Admitting: Family Medicine

## 2021-12-01 ENCOUNTER — Ambulatory Visit
Admission: RE | Admit: 2021-12-01 | Discharge: 2021-12-01 | Disposition: A | Discharge status: Home / Self Care | Payer: No Typology Code available for payment source | Source: Ambulatory Visit | Attending: Family Medicine | Admitting: Family Medicine

## 2021-12-01 ENCOUNTER — Other Ambulatory Visit: Payer: Self-pay | Admitting: Family Medicine

## 2021-12-01 ENCOUNTER — Ambulatory Visit
Admission: RE | Admit: 2021-12-01 | Discharge: 2021-12-01 | Disposition: A | Discharge status: Home / Self Care | Payer: No Typology Code available for payment source | Attending: Family Medicine | Admitting: Family Medicine

## 2021-12-01 ENCOUNTER — Encounter: Payer: Self-pay | Admitting: Family Medicine

## 2021-12-01 VITALS — BP 130/78 | HR 92 | Ht 68.0 in | Wt 266.0 lb

## 2021-12-01 DIAGNOSIS — M2392 Unspecified internal derangement of left knee: Secondary | ICD-10-CM | POA: Insufficient documentation

## 2021-12-01 DIAGNOSIS — M25462 Effusion, left knee: Secondary | ICD-10-CM | POA: Diagnosis present

## 2021-12-01 MED ORDER — MELOXICAM 15 MG PO TABS
15.0000 mg | ORAL_TABLET | Freq: Every day | ORAL | 0 refills | Status: DC
  Filled 2021-12-01: qty 14, 14d supply, fill #0

## 2021-12-01 MED ORDER — MELOXICAM 15 MG PO TABS
15.0000 mg | ORAL_TABLET | Freq: Every day | ORAL | 0 refills | Status: DC
  Filled 2021-12-01: qty 30, 30d supply, fill #0

## 2021-12-01 NOTE — Patient Instructions (Addendum)
-   Use hinged knee brace at all times, okay to remove for bathing/sleeping - Start meloxicam once daily with food - Hold from lower body athletics - Contact our office at the 1 week mark or beyond for any persistent pain without improvement - Return for follow-up at 2-3 weeks when scheduled

## 2021-12-01 NOTE — Progress Notes (Signed)
     Primary Care / Sports Medicine Office Visit  Patient Information:  Patient ID: Valerie Martinez, female DOB: 03/29/2000 Age: 21 y.o. MRN: [id number]   Valerie Martinez is a pleasant 21 y.o. female presenting with the following:  Chief Complaint  Patient presents with   Cyst    Bakers cyst lett knee    Vitals:   12/01/21 1530  BP: 130/78  Pulse: 92  SpO2: 98%   Vitals:   12/01/21 1530  Weight: 266 lb (120.7 kg)  Height: 5\' 8"  (1.727 m)   Body mass index is 40.45 kg/m.  No results found.   Independent interpretation of notes and tests performed by another provider:   Independent interpretation of left knee x-ray dated 12/01/2021 reveals mild medial tibiofemoral narrowing, trace suprapatellar effusion, sunrise view with subtle lateral patellar tilt.  Procedures performed:   None  Pertinent History, Exam, Impression, and Recommendations:   Problem List Items Addressed This Visit       Musculoskeletal and Integument   Internal derangement of left knee - Primary    Patient with left anterior and posterior medial knee pain, atraumatic in onset since 10/27 timeframe.  Of note began walking roughly 10,000 steps twice weekly since the first week of October, additionally did have some transient knee pain over the summer after dancing.  Has noted pain with sitting to standing, sitting crosslegged good, associated with swelling at the posterior knee.  Did see her PCP Dr. 11-19-1975 with trial of Lasix, advised to follow-up with our group.  Examination reveals no significant abnormalities to inspection, range of motion from 0 to 130 degrees, no pain at terminal flexion, nontender patellar facets, tenderness at the medial joint line diffusely, secondarily at the pes anserine bursa, negative anterior/posterior drawer, negative valgus/varus stressing, McMurray does localize both medially and laterally, negative Lachman's.  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  This was conveyed to the patient and her mother who served as additional historian.  Plan for hinged knee brace, scheduled meloxicam, low threshold for advanced imaging, close follow-up in 2 weeks.      Relevant Medications   meloxicam (MOBIC) 15 MG tablet     Orders & Medications Meds ordered this encounter  Medications   DISCONTD: meloxicam (MOBIC) 15 MG tablet    Sig: Take 1 tablet (15 mg total) by mouth daily.    Dispense:  14 tablet    Refill:  0   meloxicam (MOBIC) 15 MG tablet    Sig: Take 1 tablet (15 mg total) by mouth daily.    Dispense:  30 tablet    Refill:  0   No orders of the defined types were placed in this encounter.    Return in about 2 weeks (around 12/15/2021).     12/17/2021, MD   Primary Care Sports Medicine [facility]

## 2021-12-01 NOTE — Assessment & Plan Note (Signed)
Patient with left anterior and posterior medial knee pain, atraumatic in onset since 10/27 timeframe.  Of note began walking roughly 10,000 steps twice weekly since the first week of October, additionally did have some transient knee pain over the summer after dancing.  Has noted pain with sitting to standing, sitting crosslegged good, associated with swelling at the posterior knee.  Did see her PCP Dr. Althea Charon with trial of Lasix, advised to follow-up with our group.  Examination reveals no significant abnormalities to inspection, range of motion from 0 to 130 degrees, no pain at terminal flexion, nontender patellar facets, tenderness at the medial joint line diffusely, secondarily at the pes anserine bursa, negative anterior/posterior drawer, negative valgus/varus stressing, McMurray does localize both medially and laterally, negative Lachman's.  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  This was conveyed to the patient and her mother who served as additional historian.  Plan for hinged knee brace, scheduled meloxicam, low threshold for advanced imaging, close follow-up in 2 weeks.

## 2021-12-07 ENCOUNTER — Encounter: Payer: Self-pay | Admitting: Family Medicine

## 2021-12-07 DIAGNOSIS — E739 Lactose intolerance, unspecified: Secondary | ICD-10-CM

## 2021-12-07 DIAGNOSIS — E538 Deficiency of other specified B group vitamins: Secondary | ICD-10-CM

## 2021-12-07 DIAGNOSIS — E559 Vitamin D deficiency, unspecified: Secondary | ICD-10-CM

## 2021-12-07 DIAGNOSIS — Z91018 Allergy to other foods: Secondary | ICD-10-CM

## 2021-12-08 NOTE — Addendum Note (Signed)
Addended by: Smitty Cords on: 12/08/2021 06:24 PM   Modules accepted: Orders

## 2021-12-12 ENCOUNTER — Other Ambulatory Visit: Payer: No Typology Code available for payment source

## 2021-12-12 ENCOUNTER — Other Ambulatory Visit: Payer: Self-pay

## 2021-12-12 DIAGNOSIS — E559 Vitamin D deficiency, unspecified: Secondary | ICD-10-CM

## 2021-12-12 DIAGNOSIS — E538 Deficiency of other specified B group vitamins: Secondary | ICD-10-CM

## 2021-12-12 DIAGNOSIS — Z91018 Allergy to other foods: Secondary | ICD-10-CM

## 2021-12-12 DIAGNOSIS — E739 Lactose intolerance, unspecified: Secondary | ICD-10-CM

## 2021-12-13 LAB — FOOD ALLERGY PROFILE
Allergen, Salmon, f41: <0.1 kU/L
Almonds: 0.28 kU/L — ABNORMAL HIGH
CLASS: 0
CLASS: 0
CLASS: 0
CLASS: 0
CLASS: 0
CLASS: 2
Cashew IgE: <0.1 kU/L
Class: 0
Class: 0
Class: 1
Class: 2
Egg White IgE: <0.1 kU/L
Fish Cod: <0.1 kU/L
Hazelnut: 1.2 kU/L — ABNORMAL HIGH
Milk IgE: <0.1 kU/L
Peanut IgE: 0.99 kU/L — ABNORMAL HIGH
Scallop IgE: 0.12 kU/L — ABNORMAL HIGH
Sesame Seed f10: 0.1 kU/L — ABNORMAL HIGH
Shrimp IgE: 0.52 kU/L — ABNORMAL HIGH
Soybean IgE: 0.23 kU/L — ABNORMAL HIGH
Tuna IgE: <0.1 kU/L
Walnut: <0.1 kU/L
Wheat IgE: 0.1 kU/L — ABNORMAL HIGH

## 2021-12-13 LAB — INTERPRETATION:

## 2021-12-13 LAB — VITAMIN D 25 HYDROXY (VIT D DEFICIENCY, FRACTURES): Vit D, 25-Hydroxy: 9 ng/mL — ABNORMAL LOW (ref 30–100)

## 2021-12-13 LAB — VITAMIN B12: Vitamin B-12: 408 pg/mL (ref 200–1100)

## 2021-12-20 ENCOUNTER — Ambulatory Visit: Payer: No Typology Code available for payment source | Admitting: Family Medicine

## 2021-12-27 ENCOUNTER — Ambulatory Visit: Payer: No Typology Code available for payment source | Admitting: Family Medicine

## 2022-03-21 ENCOUNTER — Encounter: Payer: Self-pay | Admitting: Internal Medicine

## 2022-04-04 ENCOUNTER — Ambulatory Visit (INDEPENDENT_AMBULATORY_CARE_PROVIDER_SITE_OTHER): Payer: 59 | Admitting: Family Medicine

## 2022-04-04 ENCOUNTER — Other Ambulatory Visit: Payer: Self-pay

## 2022-04-04 ENCOUNTER — Encounter: Payer: Self-pay | Admitting: Family Medicine

## 2022-04-04 VITALS — BP 124/76 | HR 81 | Ht 68.0 in | Wt 270.0 lb

## 2022-04-04 DIAGNOSIS — M549 Dorsalgia, unspecified: Secondary | ICD-10-CM

## 2022-04-04 DIAGNOSIS — M62830 Muscle spasm of back: Secondary | ICD-10-CM | POA: Diagnosis not present

## 2022-04-04 MED ORDER — BACLOFEN 10 MG PO TABS
5.0000 mg | ORAL_TABLET | Freq: Two times a day (BID) | ORAL | 2 refills | Status: DC | PRN
  Filled 2022-04-04: qty 30, 15d supply, fill #0
  Filled 2022-11-07: qty 30, 15d supply, fill #1

## 2022-04-04 NOTE — Progress Notes (Unsigned)
Subjective:    Patient ID: Valerie Martinez, female    DOB: [birth year], 22 y.o.   MRN: [id number]  Valerie Martinez is a 22 y.o. female presenting on 04/04/2022 for Back Pain   HPI  Mid Back Pain, Left mid Back Spasms  Reports onset several months ago unsure timeline, episodic back pain flares with muscle spasms on Left mid localized area, usually can be doing well without any issues. She may notice a flare or spasm with a deep breath that can trigger localized back spasm and pain.   In past 2 weeks, only lifestyle change with more walking up to 10k steps now She admits history of asthma and taking bigger breaths at times with activity  Can go a few weeks without it, or few months. It is very sporadic.  Previously going to chiropractor and stopped Sept or Oct. For Neck and Shoulder for headache relief. She would get her back cracked at the chiropractor   Not tried any medication NSAID or Tylenol  No prior back pain problems or injury  - Denies any fevers/chills, numbness, tingling, weakness, loss of control bladder/bowel incontinence or retention, unintentional wt loss, night sweats       11/25/2021   10:36 AM 10/25/2021   10:34 AM 03/11/2021   10:46 AM  Depression screen PHQ 2/9  Decreased Interest 0 3 3  Down, Depressed, Hopeless 0 3 3  PHQ - 2 Score 0 6 6  Altered sleeping '1 3 3  '$ Tired, decreased energy '3 3 3  '$ Change in appetite '3 3 3  '$ Feeling bad or failure about yourself  0 2 3  Trouble concentrating 0 2 2  Moving slowly or fidgety/restless 0 0 1  Suicidal thoughts 0 0 0  PHQ-9 Score '7 19 21  '$ Difficult doing work/chores Not difficult at all Extremely dIfficult Extremely dIfficult    Social History   Tobacco Use   Smoking status: Never   Smokeless tobacco: Never  Vaping Use   Vaping Use: Never used  Substance Use Topics   Alcohol use: Never   Drug use: Never    Review of Systems Per HPI unless specifically indicated above     Objective:    BP 124/76    Pulse 81   Ht '5\' 8"'$  (1.727 m)   Wt 270 lb (122.5 kg)   SpO2 98%   BMI 41.05 kg/m   Wt Readings from Last 3 Encounters:  04/04/22 270 lb (122.5 kg)  12/01/21 266 lb (120.7 kg)  11/25/21 266 lb (120.7 kg)    Physical Exam Vitals and nursing note reviewed.  Constitutional:      General: She is not in acute distress.    Appearance: Normal appearance. She is well-developed. She is not diaphoretic.     Comments: Well-appearing, comfortable, cooperative  HENT:     Head: Normocephalic and atraumatic.  Eyes:     General:        Right eye: No discharge.        Left eye: No discharge.     Conjunctiva/sclera: Conjunctivae normal.  Cardiovascular:     Rate and Rhythm: Normal rate.  Pulmonary:     Effort: Pulmonary effort is normal.  Musculoskeletal:     Comments: Localized left mid paraspinal thoracic muscles with some hypertonicity spasm, non tender on exam. Has full range of motion.  Skin:    General: Skin is warm and dry.     Findings: No erythema or rash.  Neurological:  Mental Status: She is alert and oriented to person, place, and time.  Psychiatric:        Mood and Affect: Mood normal.        Behavior: Behavior normal.        Thought Content: Thought content normal.     Comments: Well groomed, good eye contact, normal speech and thoughts    Results for orders placed or performed in visit on 12/12/21  VITAMIN D 25 Hydroxy (Vit-D Deficiency, Fractures)  Result Value Ref Range   Vit D, 25-Hydroxy 9 (L) 30 - 100 ng/mL  Vitamin B12  Result Value Ref Range   Vitamin B-12 408 200 - 1,100 pg/mL  Food Allergy Profile  Result Value Ref Range   Egg White IgE <0.10 kU/L   Class 0    Peanut IgE 0.99 (H) kU/L   Class 2    Wheat IgE 0.10 (H) kU/L   CLASS 0/1    Walnut <0.10 kU/L   CLASS 0    Fish Cod <0.10 kU/L   CLASS 0    Milk IgE <0.10 kU/L   Class 0    Soybean IgE 0.23 (H) kU/L   CLASS 0/1    Shrimp IgE 0.52 (H) kU/L   Class 1    Scallop IgE 0.12 (H) kU/L    CLASS 0/1    Sesame Seed f10  0.10 (H) kU/L   CLASS 0/1    Hazelnut 1.20 (H) kU/L   CLASS 2    Cashew IgE <0.10 kU/L   CLASS 0    Almonds 0.28 (H) kU/L   CLASS 0/1    Allergen, Salmon, f41 <0.10 kU/L   CLASS 0    Tuna IgE <0.10 kU/L   CLASS 0   Interpretation:  Result Value Ref Range   Interpretation        Assessment & Plan:   Problem List Items Addressed This Visit   None Visit Diagnoses     Muscle spasm of back    -  Primary   Relevant Medications   baclofen (LIORESAL) 10 MG tablet   Mid back pain on left side       Relevant Medications   baclofen (LIORESAL) 10 MG tablet       For your Back Pain - I think that this is due to Muscle Spasms or strain - can be related to rib / spine position., - My Link Snuffer is that maybe this was being treated by the chiropractor and now can have some persistent or episodic misalignment.  Lungs are clear and doesn't really fit the pattern of a lung pain with the breathing question. Also seems unrelated to heart.  Start Baclofen (Lioresal) '10mg'$  tablets - cut in half for '5mg'$  at night for muscle relaxant - may make you sedated or sleepy (be careful driving or  working on this) if tolerated you can take every 8 hours, half or whole tab  May use Tylenol Extra Str '500mg'$  tabs - may take 1-2 tablets every 6 hours as needed  If persistent pain for few days at a time and not improving, or just episodic painful symptoms - can use Aleve OTC 1-2 pills with meal twice a day for few days. Not long term. Or can do ibuprofen 200-'600mg'$  dose 3 times a day with meal as needed.  Recommend to start using heating pad on your lower back 1-2x daily for few weeks  Follow up if not improving  Meds ordered this encounter  Medications  baclofen (LIORESAL) 10 MG tablet    Sig: Take 0.5-1 tablets (5-10 mg total) by mouth 2 (two) times daily as needed for muscle spasms.    Dispense:  30 each    Refill:  2      Follow up plan: Return if symptoms worsen or  fail to improve.  Nobie Putnam, [facility] 04/04/2022, 9:34 AM

## 2022-04-04 NOTE — Patient Instructions (Addendum)
Thank you for coming to the office today.  1. For your Back Pain - I think that this is due to Muscle Spasms or strain - can be related to rib / spine position., - My Link Snuffer is that maybe this was being treated by the chiropractor and now can have some persistent or episodic misalignment.  Start Baclofen (Lioresal) '10mg'$  tablets - cut in half for '5mg'$  at night for muscle relaxant - may make you sedated or sleepy (be careful driving or  working on this) if tolerated you can take every 8 hours, half or whole tab  May use Tylenol Extra Str '500mg'$  tabs - may take 1-2 tablets every 6 hours as needed  If persistent pain for few days at a time and not improving, or just episodic painful symptoms - can use Aleve OTC 1-2 pills with meal twice a day for few days. Not long term. Or can do ibuprofen 200-'600mg'$  dose 3 times a day with meal as needed.  Recommend to start using heating pad on your lower back 1-2x daily for few weeks  Also try a Wedge Seat Cushion to avoid nerve pinching when sitting prolonged period of time.  Lungs are clear and doesn't really fit the pattern of a lung pain with the breathing question. Also seems unrelated to heart.   Please schedule a Follow-up Appointment to: Return if symptoms worsen or fail to improve.  If you have any other questions or concerns, please feel free to call the office or send a message through [place]. You may also schedule an earlier appointment if necessary.  Additionally, you may be receiving a survey about your experience at our office within a few days to 1 week by e-mail or mail. We value your feedback.  Nobie Putnam, DO [place]

## 2022-04-05 ENCOUNTER — Encounter: Payer: Self-pay | Admitting: Family Medicine

## 2022-05-01 ENCOUNTER — Other Ambulatory Visit: Payer: Self-pay

## 2022-05-01 ENCOUNTER — Ambulatory Visit (INDEPENDENT_AMBULATORY_CARE_PROVIDER_SITE_OTHER): Payer: 59 | Admitting: Family Medicine

## 2022-05-01 ENCOUNTER — Encounter: Payer: Self-pay | Admitting: Family Medicine

## 2022-05-01 ENCOUNTER — Ambulatory Visit
Admission: RE | Admit: 2022-05-01 | Discharge: 2022-05-01 | Disposition: A | Discharge status: Home / Self Care | Payer: 59 | Source: Ambulatory Visit | Attending: Family Medicine | Admitting: Family Medicine

## 2022-05-01 ENCOUNTER — Ambulatory Visit
Admission: RE | Admit: 2022-05-01 | Discharge: 2022-05-01 | Disposition: A | Discharge status: Home / Self Care | Payer: 59 | Attending: Family Medicine | Admitting: Family Medicine

## 2022-05-01 VITALS — BP 110/80 | HR 91 | Ht 68.0 in | Wt 268.0 lb

## 2022-05-01 DIAGNOSIS — M5410 Radiculopathy, site unspecified: Secondary | ICD-10-CM | POA: Insufficient documentation

## 2022-05-01 DIAGNOSIS — M2392 Unspecified internal derangement of left knee: Secondary | ICD-10-CM

## 2022-05-01 DIAGNOSIS — M545 Low back pain, unspecified: Secondary | ICD-10-CM | POA: Diagnosis not present

## 2022-05-01 MED ORDER — MELOXICAM 15 MG PO TABS
15.0000 mg | ORAL_TABLET | Freq: Every day | ORAL | 0 refills | Status: DC
  Filled 2022-05-01: qty 30, 30d supply, fill #0

## 2022-05-01 NOTE — Progress Notes (Signed)
     Primary Care / Sports Medicine Office Visit  Patient Information:  Patient ID: Valerie Martinez, female DOB: [birth year] Age: 22 y.o. MRN: [id number]   Valerie Martinez is a pleasant 22 y.o. female presenting with the following:  Chief Complaint  Patient presents with   Leg Pain    Left leg, wasn't able to do PT, still has knee brace. Was better just over last couple weeks.     Vitals:   05/01/22 1320  BP: 110/80  Pulse: 91  SpO2: 99%   Vitals:   05/01/22 1320  Weight: 268 lb (121.6 kg)  Height: 5\' 8"  (1.727 m)   Body mass index is 40.75 kg/m.  DG Lumbar Spine Complete  Result Date: 05/01/2022 CLINICAL DATA:  right sided radicular symptoms EXAM: LUMBAR SPINE - COMPLETE 4+ VIEW COMPARISON:  None Available. FINDINGS: There is no evidence of lumbar spine fracture. Alignment is normal. Intervertebral disc spaces are maintained. IMPRESSION: Negative. Electronically Signed   By: Judie Petit.  Shick M.D.   On: 05/01/2022 15:46     Independent interpretation of notes and tests performed by another provider:   None  Procedures performed:   None  Pertinent History, Exam, Impression, and Recommendations:   [name] was seen today for leg pain.  Internal derangement of left knee Assessment & Plan: [name] presents with her mother for follow-up/recurrence to left knee pain, last seen 11/9 with concern for patellofemoral and/or meniscal derangement.  She does cite significant symptom improvement in essential resolution following home exercises and meloxicam.  Has noted severe recurrence, describes instability, near buckling episodes, denies mechanical catching or locking.  Exam with tenderness along the medial joint line, dynamic patellar maltracking noted, otherwise reassuring exam.  Given her prior and interim history, persistent concern for derangement of the patellofemoral cartilage and/or menisci.  Plan as follows: - MRI left knee without contrast - Restart usage of brace - Meloxicam x  2 weeks then as needed - Will contact the patient with results once available and determine next steps.  Orders: -     Meloxicam; Take 1 tablet (15 mg total) by mouth daily.  Dispense: 30 tablet; Refill: 0 -     MR KNEE LEFT WO CONTRAST; Future  Radicular syndrome of left leg Assessment & Plan: Acute on chronic concern with left radiating symptoms into the lower leg and foot/toes, describes numbness.  Examination with mild hamstring tightness otherwise reassuring.  Plan as follows: - Order lumbar spine x-ray - Can start home-based rehab pending x-ray results  Orders: -     DG Lumbar Spine Complete; Future     Orders & Medications Meds ordered this encounter  Medications   meloxicam (MOBIC) 15 MG tablet    Sig: Take 1 tablet (15 mg total) by mouth daily.    Dispense:  30 tablet    Refill:  0   Orders Placed This Encounter  Procedures   DG Lumbar Spine Complete   MR Knee Left  Wo Contrast     No follow-ups on file.     Jerrol Banana, MD, [facility]   Primary Care Sports Medicine Primary Care and Sports Medicine at [facility]

## 2022-05-01 NOTE — Assessment & Plan Note (Addendum)
Valerie Martinez presents with her mother for follow-up/recurrence to left knee pain, last seen 11/9 with concern for patellofemoral and/or meniscal derangement.  She does cite significant symptom improvement in essential resolution following home exercises and meloxicam.  Has noted severe recurrence, describes instability, near buckling episodes, denies mechanical catching or locking.  Exam with tenderness along the medial joint line, dynamic patellar maltracking noted, otherwise reassuring exam.  Given her prior and interim history, persistent concern for derangement of the patellofemoral cartilage and/or menisci.  Plan as follows: - MRI left knee without contrast - Restart usage of brace - Meloxicam x 2 weeks then as needed - Will contact the patient with results once available and determine next steps.

## 2022-05-01 NOTE — Assessment & Plan Note (Signed)
Acute on chronic concern with left radiating symptoms into the lower leg and foot/toes, describes numbness.  Examination with mild hamstring tightness otherwise reassuring.  Plan as follows: - Order lumbar spine x-ray - Can start home-based rehab pending x-ray results

## 2022-05-02 ENCOUNTER — Other Ambulatory Visit: Payer: Self-pay | Admitting: Family Medicine

## 2022-05-02 ENCOUNTER — Encounter: Payer: Self-pay | Admitting: Family Medicine

## 2022-05-02 MED ORDER — TRIAZOLAM 0.25 MG PO TABS
0.2500 mg | ORAL_TABLET | ORAL | 0 refills | Status: DC
  Filled 2022-05-02: qty 2, 1d supply, fill #0

## 2022-05-02 NOTE — Telephone Encounter (Signed)
Can you get her in [place] for the mobile open one?

## 2022-05-02 NOTE — Telephone Encounter (Signed)
Please advise 

## 2022-05-03 ENCOUNTER — Other Ambulatory Visit: Payer: Self-pay

## 2022-05-04 ENCOUNTER — Ambulatory Visit
Admission: RE | Admit: 2022-05-04 | Discharge: 2022-05-04 | Disposition: A | Discharge status: Home / Self Care | Payer: 59 | Source: Ambulatory Visit | Attending: Family Medicine | Admitting: Family Medicine

## 2022-05-04 DIAGNOSIS — M25562 Pain in left knee: Secondary | ICD-10-CM | POA: Diagnosis not present

## 2022-05-04 DIAGNOSIS — M2392 Unspecified internal derangement of left knee: Secondary | ICD-10-CM | POA: Diagnosis not present

## 2022-05-10 ENCOUNTER — Other Ambulatory Visit: Payer: Self-pay

## 2022-05-10 DIAGNOSIS — B9689 Other specified bacterial agents as the cause of diseases classified elsewhere: Secondary | ICD-10-CM | POA: Diagnosis not present

## 2022-05-10 DIAGNOSIS — N760 Acute vaginitis: Secondary | ICD-10-CM | POA: Diagnosis not present

## 2022-05-10 MED ORDER — METRONIDAZOLE 500 MG PO TABS
500.0000 mg | ORAL_TABLET | Freq: Two times a day (BID) | ORAL | 0 refills | Status: DC
  Filled 2022-05-10: qty 14, 7d supply, fill #0

## 2022-05-19 ENCOUNTER — Other Ambulatory Visit: Payer: Self-pay

## 2022-05-23 DIAGNOSIS — T732XXA Exhaustion due to exposure, initial encounter: Secondary | ICD-10-CM | POA: Diagnosis not present

## 2022-05-23 DIAGNOSIS — Z01411 Encounter for gynecological examination (general) (routine) with abnormal findings: Secondary | ICD-10-CM | POA: Diagnosis not present

## 2022-05-23 DIAGNOSIS — N898 Other specified noninflammatory disorders of vagina: Secondary | ICD-10-CM | POA: Diagnosis not present

## 2022-05-23 DIAGNOSIS — R7989 Other specified abnormal findings of blood chemistry: Secondary | ICD-10-CM | POA: Diagnosis not present

## 2022-05-23 DIAGNOSIS — Z1331 Encounter for screening for depression: Secondary | ICD-10-CM | POA: Diagnosis not present

## 2022-05-24 ENCOUNTER — Encounter: Payer: Self-pay | Admitting: Family Medicine

## 2022-05-24 DIAGNOSIS — E739 Lactose intolerance, unspecified: Secondary | ICD-10-CM

## 2022-05-24 DIAGNOSIS — R1084 Generalized abdominal pain: Secondary | ICD-10-CM

## 2022-06-09 ENCOUNTER — Ambulatory Visit: Payer: 59 | Admitting: Family Medicine

## 2022-06-19 ENCOUNTER — Other Ambulatory Visit: Payer: Self-pay | Admitting: Family Medicine

## 2022-06-19 DIAGNOSIS — J45990 Exercise induced bronchospasm: Secondary | ICD-10-CM

## 2022-06-20 ENCOUNTER — Other Ambulatory Visit: Payer: Self-pay

## 2022-06-20 ENCOUNTER — Other Ambulatory Visit (HOSPITAL_BASED_OUTPATIENT_CLINIC_OR_DEPARTMENT_OTHER): Payer: Self-pay

## 2022-06-20 DIAGNOSIS — R140 Abdominal distension (gaseous): Secondary | ICD-10-CM | POA: Diagnosis not present

## 2022-06-20 DIAGNOSIS — R194 Change in bowel habit: Secondary | ICD-10-CM | POA: Diagnosis not present

## 2022-06-20 DIAGNOSIS — R1013 Epigastric pain: Secondary | ICD-10-CM | POA: Diagnosis not present

## 2022-06-20 MED ORDER — PANTOPRAZOLE SODIUM 40 MG PO TBEC
40.0000 mg | DELAYED_RELEASE_TABLET | Freq: Every day | ORAL | 11 refills | Status: DC
  Filled 2022-06-20: qty 30, 30d supply, fill #0
  Filled 2022-10-06: qty 30, 30d supply, fill #1

## 2022-06-20 MED ORDER — HYOSCYAMINE SULFATE 0.125 MG SL SUBL
0.1250 mg | SUBLINGUAL_TABLET | Freq: Four times a day (QID) | SUBLINGUAL | 1 refills | Status: DC | PRN
  Filled 2022-06-20: qty 30, 8d supply, fill #0

## 2022-06-21 ENCOUNTER — Other Ambulatory Visit (HOSPITAL_COMMUNITY): Payer: Self-pay

## 2022-06-21 DIAGNOSIS — R1013 Epigastric pain: Secondary | ICD-10-CM | POA: Diagnosis not present

## 2022-06-21 DIAGNOSIS — R194 Change in bowel habit: Secondary | ICD-10-CM | POA: Diagnosis not present

## 2022-06-21 DIAGNOSIS — R140 Abdominal distension (gaseous): Secondary | ICD-10-CM | POA: Diagnosis not present

## 2022-06-21 MED ORDER — ALBUTEROL SULFATE 108 (90 BASE) MCG/ACT IN AEPB
1.0000 | INHALATION_SPRAY | RESPIRATORY_TRACT | 5 refills | Status: DC | PRN
  Filled 2022-06-21: qty 1, 30d supply, fill #0

## 2022-06-21 NOTE — Telephone Encounter (Signed)
Requested Prescriptions  Pending Prescriptions Disp Refills   Albuterol Sulfate (PROAIR RESPICLICK) 108 (90 Base) MCG/ACT AEPB 1 each 5    Sig: Inhale 1-2 puffs into the lungs as needed (wheezing, shortness of breath).     Pulmonology:  Beta Agonists 2 Passed - 06/19/2022  6:46 PM      Passed - Last BP in normal range    BP Readings from Last 1 Encounters:  05/01/22 110/80         Passed - Last Heart Rate in normal range    Pulse Readings from Last 1 Encounters:  05/01/22 91         Passed - Valid encounter within last 12 months    Recent Outpatient Visits           1 month ago Internal derangement of left knee   [place] [facility] Loron, Ocie Bob, MD   2 months ago Muscle spasm of back   [facility] Smitty Cords, DO   6 months ago Internal derangement of left knee   [facility] at [facility], Ocie Bob, MD   6 months ago Baker's cyst of knee, left   [place] [facility] Smitty Cords, DO   7 months ago Annual physical exam   [place] [facility] Althea Charon, Netta Neat, DO       Future Appointments             In 4 months Althea Charon, Netta Neat, DO [facility], [facility]

## 2022-06-22 ENCOUNTER — Other Ambulatory Visit: Payer: Self-pay

## 2022-06-22 MED ORDER — ALBUTEROL SULFATE HFA 108 (90 BASE) MCG/ACT IN AERS
2.0000 | INHALATION_SPRAY | RESPIRATORY_TRACT | 0 refills | Status: AC | PRN
Start: 2022-06-22 — End: ?
  Filled 2022-06-22: qty 6.7, 25d supply, fill #0

## 2022-06-22 MED ORDER — MONTELUKAST SODIUM 10 MG PO TABS
10.0000 mg | ORAL_TABLET | Freq: Every evening | ORAL | 5 refills | Status: DC
  Filled 2022-06-22: qty 30, 30d supply, fill #0

## 2022-06-22 MED ORDER — OLOPATADINE HCL 0.2 % OP SOLN
1.0000 [drp] | Freq: Two times a day (BID) | OPHTHALMIC | 5 refills | Status: DC
  Filled 2022-06-22: qty 3, 30d supply, fill #0

## 2022-06-23 ENCOUNTER — Other Ambulatory Visit: Payer: Self-pay

## 2022-07-06 ENCOUNTER — Ambulatory Visit: Payer: 59 | Admitting: Family Medicine

## 2022-09-19 ENCOUNTER — Other Ambulatory Visit: Payer: Self-pay

## 2022-09-19 ENCOUNTER — Telehealth (INDEPENDENT_AMBULATORY_CARE_PROVIDER_SITE_OTHER): Payer: 59 | Admitting: Family Medicine

## 2022-09-19 ENCOUNTER — Encounter: Payer: Self-pay | Admitting: Family Medicine

## 2022-09-19 VITALS — Ht 68.0 in

## 2022-09-19 DIAGNOSIS — U071 COVID-19: Secondary | ICD-10-CM | POA: Diagnosis not present

## 2022-09-19 DIAGNOSIS — J9801 Acute bronchospasm: Secondary | ICD-10-CM

## 2022-09-19 MED ORDER — BENZONATATE 100 MG PO CAPS
100.0000 mg | ORAL_CAPSULE | Freq: Three times a day (TID) | ORAL | 0 refills | Status: DC | PRN
Start: 2022-09-19 — End: 2022-11-14
  Filled 2022-09-19: qty 30, 10d supply, fill #0

## 2022-09-19 MED ORDER — PREDNISONE 10 MG PO TABS
ORAL_TABLET | ORAL | 0 refills | Status: AC
Start: 2022-09-19 — End: 2022-09-25
  Filled 2022-09-19: qty 21, 6d supply, fill #0

## 2022-09-19 NOTE — Patient Instructions (Signed)
COVID-19 COVID-19 is an infection caused by a virus called SARS-CoV-2. This type of virus is called a coronavirus. People with COVID-19 may: Have little to no symptoms. Have mild to moderate symptoms that affect their lungs and breathing. Get very sick. What are the causes? COVID-19 is caused by a virus. This virus may be in the air as droplets or on surfaces. It can spread from an infected person when they cough, sneeze, speak, sing, or breathe. You may become infected if: You breathe in the infected droplets in the air. You touch an object that has the virus on it. What increases the risk? You are at risk of getting COVID-19 if you have been around someone with the infection. You may be more likely to get very sick if: You are 76 years old or older. You have certain medical conditions, such as: Heart disease. Diabetes. Chronic respiratory disease. Cancer. Pregnancy. You are immunocompromised. This means your body cannot fight infections easily. You have a disability or trouble moving, meaning you're immobile. What are the signs or symptoms? People may have different symptoms from COVID-19. The symptoms can also be mild to severe. They often show up in 5-6 days after being infected. But they can take up to 14 days to appear. Common symptoms are: Cough. Feeling tired. New loss of taste or smell. Fever. Less common symptoms are: Sore throat. Headache. Body or muscle aches. Diarrhea. A skin rash or odd-colored fingers or toes. Red or irritated eyes. Sometimes, COVID-19 does not cause symptoms. How is this diagnosed? COVID-19 can be diagnosed with tests done in the lab or at home. Fluid from your nose, mouth, or lungs will be used to check for the virus. How is this treated? Treatment for COVID-19 depends on how sick you are. Mild symptoms can be treated at home with rest, fluids, and over-the-counter medicines. Severe symptoms may be treated in a hospital intensive care unit  (ICU). If you have symptoms and are at risk of getting very sick, you may be given a medicine that fights viruses. This medicine is called an antiviral. How is this prevented? To protect yourself from COVID-19: Know your risk factors. Get vaccinated. If your body cannot fight infections easily, talk to your provider about treatment to help prevent COVID-19. Stay at least 1 meter away from others. Wear a well-fitted mask when: You can't stay at a distance from people. You're in a place with poor air flow. Try to be in open spaces with good air flow when in public. Wash your hands often or use an alcohol-based hand sanitizer. Cover your nose and mouth when coughing and sneezing. If you think you have COVID-19 or have been around someone who has it, stay home and be by yourself for 5-10 days. Where to find more information Centers for Disease Control and Prevention (CDC): TonerProm[employer].no World Health Organization [facility]): VisitDestination.com.br Get help right away if: You have trouble breathing or get short of breath. You have pain or pressure in your chest. You cannot speak or move any part of your body. You are confused. Your symptoms get worse. These symptoms may be an emergency. Get help right away. Call 911. Do not wait to see if the symptoms will go away. Do not drive yourself to the hospital. This information is not intended to replace advice given to you by your health care provider. Make sure you discuss any questions you have with your health care provider. Document Revised: 01/17/2022 Document Reviewed: 09/23/2021 Elsevier Patient Education  2024 Elsevier Inc.   Please schedule a Follow-up Appointment to: No follow-ups on file.  If you have any other questions or concerns, please feel free to call the office or send a message through MyChart. You may also schedule an earlier appointment if necessary.  Additionally, you may be receiving a survey about your experience at our office within a few  days to 1 week by e-mail or mail. We value your feedback.  Saralyn Pilar, DO [facility], [place]

## 2022-09-19 NOTE — Progress Notes (Signed)
Subjective:    Patient ID: Valerie Martinez, female    DOB: 2000-02-13, 22 y.o.   MRN: [id number]  Bhakti Wojno is a 22 y.o. female presenting on 09/19/2022 for Cough ( posiive covid ) and Covid Positive (X 1-2 days ago,SOB, dayquil and nyquil, not helping, sinus pain and congestion medication, unchanged, no fever, runny nose, tested positive for covid yesterday )  Virtual / Telehealth Encounter - Video Visit via MyChart The purpose of this virtual visit is to provide medical care while limiting exposure to the novel coronavirus (COVID19) for both patient and office staff.  Consent was obtained for remote visit:  Yes.   Answered questions that patient had about telehealth interaction:  Yes.   I discussed the limitations, risks, security and privacy concerns of performing an evaluation and management service by video/telephone. I also discussed with the patient that there may be a patient responsible charge related to this service. The patient expressed understanding and agreed to proceed.  Patient Location: Home Provider Location: [facility] (Office)  Participants in virtual visit: - Patient: Valerie Martinez - CMA: Lear Ng CMA - Provider: Dr Althea Charon   HPI  COVID Positive Coughing Hx Exercise Induced Asthma Reports she had symptoms for 2 weeks, initially tiredness and fatigue congestion and sneezing, then recently within past 2-3 days ago with cough. She thought symptoms were initially just a cold and she took DayQuil and NyQuil. She took daytime non drowsy sinus and congestion pills. - She has Albuterol inhaler but not using PRN Denies any fever chills, body aches      09/19/2022   11:27 AM 11/25/2021   10:36 AM 10/25/2021   10:34 AM  Depression screen PHQ 2/9  Decreased Interest 0 0 3  Down, Depressed, Hopeless 0 0 3  PHQ - 2 Score 0 0 6  Altered sleeping 1 1 3   Tired, decreased energy 0 3 3  Change in appetite 1 3 3   Feeling bad or failure  about yourself  0 0 2  Trouble concentrating 0 0 2  Moving slowly or fidgety/restless 0 0 0  Suicidal thoughts 0 0 0  PHQ-9 Score 2 7 19   Difficult doing work/chores Not difficult at all Not difficult at all Extremely dIfficult    Social History   Tobacco Use   Smoking status: Never   Smokeless tobacco: Never  Vaping Use   Vaping status: Never Used  Substance Use Topics   Alcohol use: Never   Drug use: Never    Review of Systems Per HPI unless specifically indicated above     Objective:    Ht 5\' 8"  (1.727 m)   BMI 40.75 kg/m   Wt Readings from Last 3 Encounters:  05/01/22 268 lb (121.6 kg)  04/04/22 270 lb (122.5 kg)  12/01/21 266 lb (120.7 kg)    Physical Exam  Note examination was completely remotely via video observation objective data only  Gen - well-appearing, no acute distress or apparent pain, comfortable HEENT - eyes appear clear without discharge or redness Heart/Lungs - cannot examine virtually - observed no evidence of coughing or labored breathing. Abd - cannot examine virtually  Skin - face visible today- no rash Neuro - awake, alert, oriented Psych - not anxious appearing   Results for orders placed or performed in visit on 12/12/21  VITAMIN D 25 Hydroxy (Vit-D Deficiency, Fractures)  Result Value Ref Range   Vit D, 25-Hydroxy 9 (L) 30 - 100 ng/mL  Vitamin B12  Result  Value Ref Range   Vitamin B-12 408 200 - 1,100 pg/mL  Food Allergy Profile  Result Value Ref Range   Egg White IgE <0.10 kU/L   Class 0    Peanut IgE 0.99 (H) kU/L   Class 2    Wheat IgE 0.10 (H) kU/L   CLASS 0/1    Walnut <0.10 kU/L   CLASS 0    Fish Cod <0.10 kU/L   CLASS 0    Milk IgE <0.10 kU/L   Class 0    Soybean IgE 0.23 (H) kU/L   CLASS 0/1    Shrimp IgE 0.52 (H) kU/L   Class 1    Scallop IgE 0.12 (H) kU/L   CLASS 0/1    Sesame Seed f10  0.10 (H) kU/L   CLASS 0/1    Hazelnut 1.20 (H) kU/L   CLASS 2    Cashew IgE <0.10 kU/L   CLASS 0    Almonds 0.28  (H) kU/L   CLASS 0/1    Allergen, Salmon, f41 <0.10 kU/L   CLASS 0    Tuna IgE <0.10 kU/L   CLASS 0   Interpretation:  Result Value Ref Range   Interpretation        Assessment & Plan:   Problem List Items Addressed This Visit   None Visit Diagnoses     COVID-19 virus infection    -  Primary   Relevant Medications   predniSONE (DELTASONE) 10 MG tablet   benzonatate (TESSALON) 100 MG capsule   Bronchospasm, acute       Relevant Medications   predniSONE (DELTASONE) 10 MG tablet   benzonatate (TESSALON) 100 MG capsule       COVID19 positive Symptom 1st onset 1-2 week ago onset Confirm home test positive 09/18/22 Mild to moderate symptoms currently. No red flags or dyspnea Risk factor with exercise induced asthma  Defer COVID therapy at this time given mild to moderate symptoms. Start Tessalon Perls take 1 capsule up to 3 times a day as needed for cough Use Albuterol AS NEEDED Rx Prednisone taper for her asthma symptoms, can pause if not preferred to take Supportive care OTC PRN Follow-up criteria given.   Meds ordered this encounter  Medications   predniSONE (DELTASONE) 10 MG tablet    Sig: Take 6 tabs with breakfast Day 1, 5 tabs Day 2, 4 tabs Day 3, 3 tabs Day 4, 2 tabs Day 5, 1 tab Day 6.    Dispense:  21 tablet    Refill:  0   benzonatate (TESSALON) 100 MG capsule    Sig: Take 1 capsule (100 mg total) by mouth 3 (three) times daily as needed for cough.    Dispense:  30 capsule    Refill:  0      Follow up plan: Return if symptoms worsen or fail to improve.  Patient verbalizes understanding with the above medical recommendations including the limitation of remote medical advice.  Specific follow-up and call-back criteria were given for patient to follow-up or seek medical care more urgently if needed.  Total duration of direct patient care provided via video conference: 10 minutes   Saralyn Pilar, DO [facility] 09/19/2022, 11:45 AM

## 2022-10-10 ENCOUNTER — Other Ambulatory Visit: Payer: Self-pay

## 2022-10-31 ENCOUNTER — Ambulatory Visit: Payer: 59 | Admitting: Family Medicine

## 2022-11-06 ENCOUNTER — Other Ambulatory Visit: Payer: Self-pay | Admitting: Family Medicine

## 2022-11-06 DIAGNOSIS — Z0000 Encounter for general adult medical examination without abnormal findings: Secondary | ICD-10-CM

## 2022-11-06 DIAGNOSIS — R7309 Other abnormal glucose: Secondary | ICD-10-CM

## 2022-11-07 ENCOUNTER — Other Ambulatory Visit: Payer: 59

## 2022-11-07 DIAGNOSIS — Z6841 Body Mass Index (BMI) 40.0 and over, adult: Secondary | ICD-10-CM | POA: Diagnosis not present

## 2022-11-07 DIAGNOSIS — R7309 Other abnormal glucose: Secondary | ICD-10-CM | POA: Diagnosis not present

## 2022-11-07 DIAGNOSIS — Z0000 Encounter for general adult medical examination without abnormal findings: Secondary | ICD-10-CM | POA: Diagnosis not present

## 2022-11-08 LAB — COMPLETE METABOLIC PANEL WITH GFR
AG Ratio: 1.4 (calc) (ref 1.0–2.5)
ALT: 11 U/L (ref 6–29)
AST: 13 U/L (ref 10–30)
Albumin: 4.4 g/dL (ref 3.6–5.1)
Alkaline phosphatase (APISO): 55 U/L (ref 31–125)
BUN: 12 mg/dL (ref 7–25)
CO2: 28 mmol/L (ref 20–32)
Calcium: 9.8 mg/dL (ref 8.6–10.2)
Chloride: 102 mmol/L (ref 98–110)
Creat: 0.81 mg/dL (ref 0.50–0.96)
Globulin: 3.1 g/dL (ref 1.9–3.7)
Glucose, Bld: 108 mg/dL — ABNORMAL HIGH (ref 65–99)
Potassium: 4.3 mmol/L (ref 3.5–5.3)
Sodium: 137 mmol/L (ref 135–146)
Total Bilirubin: 0.4 mg/dL (ref 0.2–1.2)
Total Protein: 7.5 g/dL (ref 6.1–8.1)
eGFR: 106 mL/min/{1.73_m2} (ref >=60)

## 2022-11-08 LAB — CBC WITH DIFFERENTIAL/PLATELET
Absolute Lymphocytes: 2117 {cells}/uL (ref 850–3900)
Absolute Monocytes: 574 {cells}/uL (ref 200–950)
Basophils Absolute: 29 {cells}/uL (ref 0–200)
Basophils Relative: 0.5 %
Eosinophils Absolute: 81 {cells}/uL (ref 15–500)
Eosinophils Relative: 1.4 %
HCT: 43 % (ref 35.0–45.0)
Hemoglobin: 14 g/dL (ref 11.7–15.5)
MCH: 29 pg (ref 27.0–33.0)
MCHC: 32.6 g/dL (ref 32.0–36.0)
MCV: 89.2 fL (ref 80.0–100.0)
MPV: 10.5 fL (ref 7.5–12.5)
Monocytes Relative: 9.9 %
Neutro Abs: 2999 {cells}/uL (ref 1500–7800)
Neutrophils Relative %: 51.7 %
Platelets: 291 10*3/uL (ref 140–400)
RBC: 4.82 10*6/uL (ref 3.80–5.10)
RDW: 13.5 % (ref 11.0–15.0)
Total Lymphocyte: 36.5 %
WBC: 5.8 10*3/uL (ref 3.8–10.8)

## 2022-11-08 LAB — LIPID PANEL
Cholesterol: 169 mg/dL (ref <200)
HDL: 57 mg/dL (ref >=50)
LDL Cholesterol (Calc): 97 mg/dL
Non-HDL Cholesterol (Calc): 112 mg/dL (ref <130)
Total CHOL/HDL Ratio: 3 (calc) (ref <5.0)
Triglycerides: 67 mg/dL (ref <150)

## 2022-11-08 LAB — TSH: TSH: 0.73 m[IU]/L

## 2022-11-08 LAB — HEMOGLOBIN A1C
Hgb A1c MFr Bld: 5.1 %{Hb} (ref <5.7)
Mean Plasma Glucose: 100 mg/dL
eAG (mmol/L): 5.5 mmol/L

## 2022-11-14 ENCOUNTER — Other Ambulatory Visit: Payer: Self-pay

## 2022-11-14 ENCOUNTER — Ambulatory Visit: Payer: 59 | Admitting: Family Medicine

## 2022-11-14 ENCOUNTER — Encounter: Payer: Self-pay | Admitting: Family Medicine

## 2022-11-14 VITALS — BP 114/76 | HR 75 | Ht 68.25 in | Wt 267.0 lb

## 2022-11-14 DIAGNOSIS — K219 Gastro-esophageal reflux disease without esophagitis: Secondary | ICD-10-CM

## 2022-11-14 DIAGNOSIS — Z0000 Encounter for general adult medical examination without abnormal findings: Secondary | ICD-10-CM

## 2022-11-14 DIAGNOSIS — Z6841 Body Mass Index (BMI) 40.0 and over, adult: Secondary | ICD-10-CM | POA: Diagnosis not present

## 2022-11-14 DIAGNOSIS — Z23 Encounter for immunization: Secondary | ICD-10-CM | POA: Diagnosis not present

## 2022-11-14 MED ORDER — PANTOPRAZOLE SODIUM 40 MG PO TBEC
40.0000 mg | DELAYED_RELEASE_TABLET | Freq: Two times a day (BID) | ORAL | 2 refills | Status: DC
Start: 2022-11-14 — End: 2023-02-20
  Filled 2022-11-14: qty 60, 30d supply, fill #0
  Filled 2022-12-26: qty 60, 30d supply, fill #1
  Filled 2023-01-20: qty 60, 30d supply, fill #2

## 2022-11-14 NOTE — Progress Notes (Signed)
Subjective:    Patient ID: Valerie Martinez, female    DOB: 08-03-2000, 22 y.o.   MRN: [id number]  Valerie Martinez is a 22 y.o. female presenting on 11/14/2022 for Annual Exam   HPI  Here for Annual Physical and Lab Review  Discussed the use of AI scribe software for clinical note transcription with the patient, who gave verbal consent to proceed.     The patient, a 22 year old with a history of asthma, presented for an annual physical check-up.  Lab review today   Wellness / Lifestyle Today she reports overall improving lifestyle Weight improved to stable down 3 lbs in past 6 months  LDL up to 97, still normal range, noted trend up from 81 previously    Mild-moderate Exercise Induced Asthma Environmental Seasonal Asthma Followed by Dr Gary Fleet - Asthma/Allergy Has not followed back up with Allergist given lack of changes made by specialist. Not on Breo. Use Albuterol AS NEEDED   GERD vs LPR / Nausea Dietary Allergy vs Intolerance  In addition to these issues, the patient reported a progressive intolerance to certain foods, initially starting with beef and pork, and now extending to other food groups. This intolerance manifests as increased acid reflux, for which she has been taking over-the-counter medications and Pantoprazole daily. Despite these measures, the patient reported that her symptoms have not significantly improved.  Takes Pantoprazole 40mg  daily can take Levsin SL AS NEEDED, worse with beef pork or grease She can still get bad flares of stomach acid at times, causes hoarse voice Initial allergist test for alpha gal was negative She is trying to continue avoiding dairy and beef pork, seems like list keeps changing, avoiding spicy or greasy She saw [facility], and was tested gluten and celiac and both were negative The GI specialist did not have much else to offer at that time    Headaches, chronic History of tension headaches. Anxiety  The patient also reported  experiencing headaches, which she described as a frontal throbbing pain that worsens throughout the day and is relieved by sleep. These headaches are associated with visual disturbances, but an eye examination conducted in May by her Eye Dr showed only a slight decrease in the left eye's vision. The patient has tried various over-the-counter medications for these headaches, but she has not found a consistently effective treatment.  Seems to be tension headaches, anxiety stress, worse at end of day also affecting her blurry vision Takes OTC 2 aleve + 2 tylenol AS NEEDED, drink water, cold compress, and lay down, improved when wake up Tried Magnesium supplement for headaches  Took Maxalt rarely but not regularly. Says did not help all day   Additional topics  Back Spasm, Left mid low back R Shoulder Pain / Popping  Reports episodic symptoms with back spasm, can be random or sporadic with occurrences that flare up. She has taken Baclofen AS NEEDED with some good results. Not taking every day. Has used new refill. Described as a catching sensation in the mid to lower back, particularly during twisting movements. She also reported right shoulder discomfort, characterized by frequent popping and occasional pain during certain movements. The patient recently started a gym membership and has begun working out, which may be contributing to these musculoskeletal symptoms. Reports recent increasing popping in R shoulder, she has been driving a lot more.     Health Maintenance: Due for Flu Shot, will receive today      11/14/2022    9:09 AM 09/19/2022  11:27 AM 11/25/2021   10:36 AM  Depression screen PHQ 2/9  Decreased Interest 1 0 0  Down, Depressed, Hopeless 1 0 0  PHQ - 2 Score 2 0 0  Altered sleeping 2 1 1   Tired, decreased energy 1 0 3  Change in appetite 1 1 3   Feeling bad or failure about yourself  1 0 0  Trouble concentrating 0 0 0  Moving slowly or fidgety/restless 0 0 0  Suicidal  thoughts 0 0 0  PHQ-9 Score 7 2 7   Difficult doing work/chores Somewhat difficult Not difficult at all Not difficult at all      11/14/2022    9:09 AM 09/19/2022   11:27 AM 11/25/2021   10:37 AM 10/25/2021   10:35 AM  GAD 7 : Generalized Anxiety Score  Nervous, Anxious, on Edge 0 0 0 0  Control/stop worrying 1 0 0 2  Worry too much - different things 1 0 0 2  Trouble relaxing 0 1 1 1   Restless 0 0 0 0  Easily annoyed or irritable 1 0 0 3  Afraid - awful might happen 0 0 0 0  Total GAD 7 Score 3 1 1 8   Anxiety Difficulty  Not difficult at all Somewhat difficult       Past Medical History:  Diagnosis Date   Allergy    Asthma    GERD (gastroesophageal reflux disease)    Really been struggling lately   Past Surgical History:  Procedure Laterality Date   WISDOM TOOTH EXTRACTION     Social History   Socioeconomic History   Marital status: Single    Spouse name: Not on file   Number of children: Not on file   Years of education: Not on file   Highest education level: Not on file  Occupational History   Not on file  Tobacco Use   Smoking status: Never   Smokeless tobacco: Never  Vaping Use   Vaping status: Never Used  Substance and Sexual Activity   Alcohol use: Never   Drug use: Never   Sexual activity: Never    Birth control/protection: None  Other Topics Concern   Not on file  Social History Narrative   Not on file   Social Determinants of Health   Financial Resource Strain: Not on file  Food Insecurity: Not on file  Transportation Needs: Not on file  Physical Activity: Not on file  Stress: Not on file  Social Connections: Not on file  Intimate Partner Violence: Not on file   Family History  Problem Relation Age of Onset   Asthma Mother    Obesity Mother    Cancer Maternal Grandmother    Arthritis Maternal Grandmother    Hyperlipidemia Maternal Grandmother    Hypertension Maternal Grandmother    Arthritis Maternal Grandfather    Diabetes Maternal  Grandfather    Hypertension Maternal Grandfather    Kidney disease Maternal Grandfather    Breast cancer Neg Hx    Ovarian cancer Neg Hx    Colon cancer Neg Hx    Current Outpatient Medications on File Prior to Visit  Medication Sig   albuterol (VENTOLIN HFA) 108 (90 Base) MCG/ACT inhaler Inhale 2 puffs into the lungs every 4-6 hours as needed for cough/wheeze.   Albuterol Sulfate (PROAIR RESPICLICK) 108 (90 Base) MCG/ACT AEPB Inhale 1-2 puffs into the lungs as needed (wheezing, shortness of breath).   baclofen (LIORESAL) 10 MG tablet Take 0.5-1 tablets (5-10 mg total) by mouth 2 (  two) times daily as needed for muscle spasms.   EPINEPHrine 0.3 mg/0.3 mL IJ SOAJ injection use one auto-injector into outer thigh as needed for  anaphylaxis   fexofenadine (ALLEGRA) 180 MG tablet Take 180 mg by mouth daily.   hyoscyamine (LEVSIN SL) 0.125 MG SL tablet Place 1 tablet (0.125 mg total) under the tongue every 6 (six) hours as needed for abdominal pain   meloxicam (MOBIC) 15 MG tablet Take 1 tablet (15 mg total) by mouth daily.   No current facility-administered medications on file prior to visit.    Review of Systems  Constitutional:  Negative for activity change, appetite change, chills, diaphoresis, fatigue and fever.  HENT:  Negative for congestion and hearing loss.   Eyes:  Negative for visual disturbance.  Respiratory:  Positive for cough. Negative for chest tightness, shortness of breath and wheezing.   Cardiovascular:  Negative for chest pain, palpitations and leg swelling.  Gastrointestinal:  Positive for nausea. Negative for abdominal pain, constipation, diarrhea and vomiting.       GERD  Genitourinary:  Negative for dysuria, frequency and hematuria.  Musculoskeletal:  Negative for arthralgias and neck pain.  Skin:  Negative for rash.  Neurological:  Positive for headaches. Negative for dizziness, weakness, light-headedness and numbness.  Hematological:  Negative for adenopathy.   Psychiatric/Behavioral:  Negative for behavioral problems, dysphoric mood and sleep disturbance.    Per HPI unless specifically indicated above      Objective:    BP 114/76   Pulse 75   Ht 5' 8.25" (1.734 m)   Wt 267 lb (121.1 kg)   SpO2 98%   BMI 40.30 kg/m   Wt Readings from Last 3 Encounters:  11/14/22 267 lb (121.1 kg)  05/01/22 268 lb (121.6 kg)  04/04/22 270 lb (122.5 kg)    Physical Exam Vitals and nursing note reviewed.  Constitutional:      General: She is not in acute distress.    Appearance: She is well-developed. She is obese. She is not diaphoretic.     Comments: Well-appearing, comfortable, cooperative  HENT:     Head: Normocephalic and atraumatic.  Eyes:     General:        Right eye: No discharge.        Left eye: No discharge.     Conjunctiva/sclera: Conjunctivae normal.     Pupils: Pupils are equal, round, and reactive to light.  Neck:     Thyroid: No thyromegaly.  Cardiovascular:     Rate and Rhythm: Normal rate and regular rhythm.     Pulses: Normal pulses.     Heart sounds: Normal heart sounds. No murmur heard. Pulmonary:     Effort: Pulmonary effort is normal. No respiratory distress.     Breath sounds: Normal breath sounds. No wheezing or rales.  Abdominal:     General: Bowel sounds are normal. There is no distension.     Palpations: Abdomen is soft. There is no mass.     Tenderness: There is no abdominal tenderness.  Musculoskeletal:        General: No tenderness. Normal range of motion.     Cervical back: Normal range of motion and neck supple.     Comments: Upper / Lower Extremities: - Normal muscle tone, strength bilateral upper extremities 5/5, lower extremities 5/5  Lymphadenopathy:     Cervical: No cervical adenopathy.  Skin:    General: Skin is warm and dry.     Findings: No erythema or rash.  Neurological:     Mental Status: She is alert and oriented to person, place, and time.     Comments: Distal sensation intact to light  touch all extremities  Psychiatric:        Mood and Affect: Mood normal.        Behavior: Behavior normal.        Thought Content: Thought content normal.     Comments: Well groomed, good eye contact, normal speech and thoughts       Results for orders placed or performed in visit on 11/06/22  Hemoglobin A1c  Result Value Ref Range   Hgb A1c MFr Bld 5.1 <5.7 % of total Hgb   Mean Plasma Glucose 100 mg/dL   eAG (mmol/L) 5.5 mmol/L  Lipid panel  Result Value Ref Range   Cholesterol 169 <200 mg/dL   HDL 57 > OR = 50 mg/dL   Triglycerides 67 <324 mg/dL   LDL Cholesterol (Calc) 97 mg/dL (calc)   Total CHOL/HDL Ratio 3.0 <5.0 (calc)   Non-HDL Cholesterol (Calc) 112 <130 mg/dL (calc)  COMPLETE METABOLIC PANEL WITH GFR  Result Value Ref Range   Glucose, Bld 108 (H) 65 - 99 mg/dL   BUN 12 7 - 25 mg/dL   Creat 4.01 0.27 - 2.53 mg/dL   eGFR 664 > OR = 60 QI/HKV/4.25Z5   BUN/Creatinine Ratio SEE NOTE: 6 - 22 (calc)   Sodium 137 135 - 146 mmol/L   Potassium 4.3 3.5 - 5.3 mmol/L   Chloride 102 98 - 110 mmol/L   CO2 28 20 - 32 mmol/L   Calcium 9.8 8.6 - 10.2 mg/dL   Total Protein 7.5 6.1 - 8.1 g/dL   Albumin 4.4 3.6 - 5.1 g/dL   Globulin 3.1 1.9 - 3.7 g/dL (calc)   AG Ratio 1.4 1.0 - 2.5 (calc)   Total Bilirubin 0.4 0.2 - 1.2 mg/dL   Alkaline phosphatase (APISO) 55 31 - 125 U/L   AST 13 10 - 30 U/L   ALT 11 6 - 29 U/L  CBC with Differential/Platelet  Result Value Ref Range   WBC 5.8 3.8 - 10.8 Thousand/uL   RBC 4.82 3.80 - 5.10 Million/uL   Hemoglobin 14.0 11.7 - 15.5 g/dL   HCT 63.8 75.6 - 43.3 %   MCV 89.2 80.0 - 100.0 fL   MCH 29.0 27.0 - 33.0 pg   MCHC 32.6 32.0 - 36.0 g/dL   RDW 29.5 18.8 - 41.6 %   Platelets 291 140 - 400 Thousand/uL   MPV 10.5 7.5 - 12.5 fL   Neutro Abs 2,999 1,500 - 7,800 cells/uL   Absolute Lymphocytes 2,117 850 - 3,900 cells/uL   Absolute Monocytes 574 200 - 950 cells/uL   Eosinophils Absolute 81 15 - 500 cells/uL   Basophils Absolute 29 0 -  200 cells/uL   Neutrophils Relative % 51.7 %   Total Lymphocyte 36.5 %   Monocytes Relative 9.9 %   Eosinophils Relative 1.4 %   Basophils Relative 0.5 %  TSH  Result Value Ref Range   TSH 0.73 mIU/L      Assessment & Plan:   Problem List Items Addressed This Visit     GERD (gastroesophageal reflux disease)   Relevant Medications   pantoprazole (PROTONIX) 40 MG tablet   Morbid obesity with BMI of 40.0-44.9, adult (HCC)   Other Visit Diagnoses     Annual physical exam    -  Primary   Need for influenza vaccination  Relevant Orders   Flu vaccine trivalent PF, 6mos and older(Flulaval,Afluria,Fluarix,Fluzone) (Completed)   LPRD (laryngopharyngeal reflux disease)       Relevant Medications   pantoprazole (PROTONIX) 40 MG tablet       Updated Health Maintenance information Reviewed recent lab results with patient Encouraged improvement to lifestyle with diet and exercise Goal of weight loss  Assessment and Plan    Asthma Exercise-induced.  Patient is managing symptoms by avoiding triggers. Previously followed by Allergist/Asthma specialist. Has not returned. Off maintenance therapy -Continue current management strategy.  Right Shoulder Pain Low to mid Back Pain Popping and occasional pain with certain movements. Likely due to repetitive strain. No signs of acute injury. -Start with range of motion stretching exercises for Shoulder -Once mobility improves, consider a strengthening regimen with low weights and high repetitions. -Continue use of over-the-counter anti-inflammatories and muscle relaxants as needed.  LPR vs Gastroesophageal Reflux Disease (GERD) History most suggestive of silent reflux LPR without heartburn. Symptoms not controlled on single dose Pantoprazole. Negative for gluten and alpha-gal allergies. -Double dose of Pantoprazole for a period of 8 weeks. -Consider return to [facility] for further evaluation if they were unable to identify source of her  problem may consider upper endoscopy if symptoms persist.  Chronic Tension Headaches Chronic, likely related to stress and tension. Not responsive to Maxalt. No signs of migraines. -Continue over-the-counter pain relief as needed. -Consider magnesium supplement for headache relief. -Consider referral to a headache specialist if symptoms persist or worsen. She prefers to avoid Rx medication, would defer TCA Nortriptyline at this time  General Health Maintenance -Administer influenza vaccine today. -Continue annual physical examinations.      Orders Placed This Encounter  Procedures   Flu vaccine trivalent PF, 6mos and older(Flulaval,Afluria,Fluarix,Fluzone)      Meds ordered this encounter  Medications   pantoprazole (PROTONIX) 40 MG tablet    Sig: Take 1 tablet (40 mg total) by mouth 2 (two) times daily before a meal.    Dispense:  60 tablet    Refill:  2      Follow up plan: Return in about 1 year (around 11/14/2023) for 1 year .  Saralyn Pilar, DO [facility] 11/14/2022, 9:43 AM

## 2022-11-14 NOTE — Patient Instructions (Addendum)
Thank you for coming to the office today.  Consider the Magnesium at bedtime for chronic tension headaches  Try double dose Pantoprazole 40mg  twice a day for stomach acid symptoms, it sounds like Silent Reflux.   Please schedule a Follow-up Appointment to: Return in about 1 year (around 11/14/2023) for 1 year .  If you have any other questions or concerns, please feel free to call the office or send a message through MyChart. You may also schedule an earlier appointment if necessary.  Additionally, you may be receiving a survey about your experience at our office within a few days to 1 week by e-mail or mail. We value your feedback.  Saralyn Pilar, DO [facility]  Range of Motion Shoulder Exercises  Pendulum Circles - Lean with your good arm against a counter or table for support [facility] forward with a wide stance (make sure your body is comfortable) - Your painful shoulder should hang down and feel "heavy" - Gently move your painful arm in small circles "clockwise" for several turns - Switch to "counterclockwise" for several turns - Early on keep circles narrow and move slowly - Later in rehab, move in larger circles and faster movement   Wall Crawl - Stand close (about 1-2 ft away) to a wall, facing it directly - Reach out with your arm of painful shoulder and place fingers (not palm) on wall - You should make contact with wall at your waist level - Slowly walk your fingers up the wall. Stay in contact with wall entire time, do not remove fingers - Keep walking fingers up wall until you reach shoulder level - You may feel tightening or mild discomfort, once you reach a height that causes pain or if you are already above your shoulder height then stop. Repeat from starting position. - Early on stand closer to wall, move fingers slowly, and stay at or below shoulder level - Later in rehab, stand farther away from wall (fingertips), move fingers  quicker, go above shoulder level    ----------------  Shoulder Exercises Ask your health care provider which exercises are safe for you. Do exercises exactly as told by your health care provider and adjust them as directed. It is normal to feel mild stretching, pulling, tightness, or discomfort as you do these exercises. Stop right away if you feel sudden pain or your pain gets worse. Do not begin these exercises until told by your health care provider. Stretching exercises External rotation and abduction This exercise is sometimes called corner stretch. The exercise rotates your arm outward (external rotation) and moves your arm out from your body (abduction). Stand in a doorway with one of your feet slightly in front of the other. This is called a staggered stance. If you cannot reach your forearms to the door frame, stand facing a corner of a room. Choose one of the following positions as told by your health care provider: Place your hands and forearms on the door frame above your head. Place your hands and forearms on the door frame at the height of your head. Place your hands on the door frame at the height of your elbows. Slowly move your weight onto your front foot until you feel a stretch across your chest and in the front of your shoulders. Keep your head and chest upright and keep your abdominal muscles tight. Hold for __________ seconds. To release the stretch, shift your weight to your back foot. Repeat __________ times. Complete this exercise  __________ times a day. Extension, standing  Stand and hold a broomstick, a cane, or a similar object behind your back. Your hands should be a little wider than shoulder-width apart. Your palms should face away from your back. Keeping your elbows straight and your shoulder muscles relaxed, move the stick away from your body until you feel a stretch in your shoulders (extension). Avoid shrugging your shoulders while you move the stick. Keep  your shoulder blades tucked down toward the middle of your back. Hold for __________ seconds. Slowly return to the starting position. Repeat __________ times. Complete this exercise __________ times a day. Range-of-motion exercises Pendulum  Stand near a wall or a surface that you can hold onto for balance. Bend at the waist and let your left / right arm hang straight down. Use your other arm to support you. Keep your back straight and do not lock your knees. Relax your left / right arm and shoulder muscles, and move your hips and your trunk so your left / right arm swings freely. Your arm should swing because of the motion of your body, not because you are using your arm or shoulder muscles. Keep moving your hips and trunk so your arm swings in the following directions, as told by your health care provider: Side to side. Forward and backward. In clockwise and counterclockwise circles. Continue each motion for __________ seconds, or for as long as told by your health care provider. Slowly return to the starting position. Repeat __________ times. Complete this exercise __________ times a day. Shoulder flexion, standing  Stand and hold a broomstick, a cane, or a similar object. Place your hands a little more than shoulder-width apart on the object. Your left / right hand should be palm-up, and your other hand should be palm-down. Keep your elbow straight and your shoulder muscles relaxed. Push the stick up with your healthy arm to raise your left / right arm in front of your body, and then over your head until you feel a stretch in your shoulder (flexion). Avoid shrugging your shoulder while you raise your arm. Keep your shoulder blade tucked down toward the middle of your back. Hold for __________ seconds. Slowly return to the starting position. Repeat __________ times. Complete this exercise __________ times a day. Shoulder abduction, standing  Stand and hold a broomstick, a cane, or a  similar object. Place your hands a little more than shoulder-width apart on the object. Your left / right hand should be palm-up, and your other hand should be palm-down. Keep your elbow straight and your shoulder muscles relaxed. Push the object across your body toward your left / right side. Raise your left / right arm to the side of your body (abduction) until you feel a stretch in your shoulder. Do not raise your arm above shoulder height unless your health care provider tells you to do that. If directed, raise your arm over your head. Avoid shrugging your shoulder while you raise your arm. Keep your shoulder blade tucked down toward the middle of your back. Hold for __________ seconds. Slowly return to the starting position. Repeat __________ times. Complete this exercise __________ times a day. Internal rotation  Place your left / right hand behind your back, palm-up. Use your other hand to dangle an exercise band, a broomstick, or a similar object over your shoulder. Grasp the band with your left / right hand so you are holding on to both ends. Gently pull up on the band until you feel  a stretch in the front of your left / right shoulder. The movement of your arm toward the center of your body is called internal rotation. Avoid shrugging your shoulder while you raise your arm. Keep your shoulder blade tucked down toward the middle of your back. Hold for __________ seconds. Release the stretch by letting go of the band and lowering your hands. Repeat __________ times. Complete this exercise __________ times a day. Strengthening exercises External rotation  Sit in a stable chair without armrests. Secure an exercise band to a stable object at elbow height on your left / right side. Place a soft object, such as a folded towel or a small pillow, between your left / right upper arm and your body to move your elbow about 4 inches (10 cm) away from your side. Hold the end of the exercise band  so it is tight and there is no slack. Keeping your elbow pressed against the soft object, slowly move your forearm out, away from your abdomen (external rotation). Keep your body steady so only your forearm moves. Hold for __________ seconds. Slowly return to the starting position. Repeat __________ times. Complete this exercise __________ times a day. Shoulder abduction  Sit in a stable chair without armrests, or stand up. Hold a __________ lb / kg weight in your left / right hand, or hold an exercise band with both hands. Start with your arms straight down and your left / right palm facing in, toward your body. Slowly lift your left / right hand out to your side (abduction). Do not lift your hand above shoulder height unless your health care provider tells you that this is safe. Keep your arms straight. Avoid shrugging your shoulder while you do this movement. Keep your shoulder blade tucked down toward the middle of your back. Hold for __________ seconds. Slowly lower your arm, and return to the starting position. Repeat __________ times. Complete this exercise __________ times a day. Shoulder extension  Sit in a stable chair without armrests, or stand up. Secure an exercise band to a stable object in front of you so it is at shoulder height. Hold one end of the exercise band in each hand. Straighten your elbows and lift your hands up to shoulder height. Squeeze your shoulder blades together as you pull your hands down to the sides of your thighs (extension). Stop when your hands are straight down by your sides. Do not let your hands go behind your body. Hold for __________ seconds. Slowly return to the starting position. Repeat __________ times. Complete this exercise __________ times a day. Shoulder row  Sit in a stable chair without armrests, or stand up. Secure an exercise band to a stable object in front of you so it is at chest height. Hold one end of the exercise band in each  hand. Position your palms so that your thumbs are facing the ceiling (neutral position). Bend each of your elbows to a 90-degree angle (right angle) and keep your upper arms at your sides. Step back or move the chair back until the band is tight and there is no slack. Slowly pull your elbows back behind you. Hold for __________ seconds. Slowly return to the starting position. Repeat __________ times. Complete this exercise __________ times a day. Shoulder press-ups  Sit in a stable chair that has armrests. Sit upright, with your feet flat on the floor. Put your hands on the armrests so your elbows are bent and your fingers are pointing forward. Your  hands should be about even with the sides of your body. Push down on the armrests and use your arms to lift yourself off the chair. Straighten your elbows and lift yourself up as much as you comfortably can. Move your shoulder blades down, and avoid letting your shoulders move up toward your ears. Keep your feet on the ground. As you get stronger, your feet should support less of your body weight as you lift yourself up. Hold for __________ seconds. Slowly lower yourself back into the chair. Repeat __________ times. Complete this exercise __________ times a day. Wall push-ups  Stand so you are facing a stable wall. Your feet should be about one arm-length away from the wall. Lean forward and place your palms on the wall at shoulder height. Keep your feet flat on the floor as you bend your elbows and lean forward toward the wall. Hold for __________ seconds. Straighten your elbows to push yourself back to the starting position. Repeat __________ times. Complete this exercise __________ times a day. This information is not intended to replace advice given to you by your health care provider. Make sure you discuss any questions you have with your health care provider. Document Revised: 03/01/2021 Document Reviewed: 03/01/2021 Elsevier Patient  Education  2024 [employer].

## 2022-11-23 DIAGNOSIS — R300 Dysuria: Secondary | ICD-10-CM | POA: Diagnosis not present

## 2022-11-23 DIAGNOSIS — N760 Acute vaginitis: Secondary | ICD-10-CM | POA: Diagnosis not present

## 2022-12-19 ENCOUNTER — Other Ambulatory Visit: Payer: Self-pay

## 2022-12-19 ENCOUNTER — Ambulatory Visit (INDEPENDENT_AMBULATORY_CARE_PROVIDER_SITE_OTHER): Payer: 59 | Admitting: Family Medicine

## 2022-12-19 VITALS — BP 118/78 | Ht 70.0 in | Wt 270.4 lb

## 2022-12-19 DIAGNOSIS — M62830 Muscle spasm of back: Secondary | ICD-10-CM | POA: Diagnosis not present

## 2022-12-19 DIAGNOSIS — G8929 Other chronic pain: Secondary | ICD-10-CM | POA: Diagnosis not present

## 2022-12-19 DIAGNOSIS — M546 Pain in thoracic spine: Secondary | ICD-10-CM

## 2022-12-19 DIAGNOSIS — E739 Lactose intolerance, unspecified: Secondary | ICD-10-CM

## 2022-12-19 DIAGNOSIS — M542 Cervicalgia: Secondary | ICD-10-CM | POA: Diagnosis not present

## 2022-12-19 MED ORDER — TIZANIDINE HCL 4 MG PO TABS
2.0000 mg | ORAL_TABLET | Freq: Three times a day (TID) | ORAL | 2 refills | Status: DC | PRN
Start: 2022-12-19 — End: 2023-12-19
  Filled 2022-12-19: qty 30, 10d supply, fill #0
  Filled 2023-03-02: qty 30, 10d supply, fill #1
  Filled 2023-07-13: qty 30, 10d supply, fill #2

## 2022-12-19 NOTE — Patient Instructions (Addendum)
   Please schedule a Follow-up Appointment to: Return if symptoms worsen or fail to improve.  If you have any other questions or concerns, please feel free to call the office or send a message through [place]. You may also schedule an earlier appointment if necessary.  Additionally, you may be receiving a survey about your experience at our office within a few days to 1 week by e-mail or mail. We value your feedback.  Nobie Putnam, DO [place]
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
## 2023-01-03 ENCOUNTER — Ambulatory Visit: Payer: 59 | Attending: Family Medicine

## 2023-01-03 ENCOUNTER — Encounter: Payer: Self-pay | Admitting: Physical Therapy

## 2023-01-03 DIAGNOSIS — M542 Cervicalgia: Secondary | ICD-10-CM | POA: Diagnosis not present

## 2023-01-03 DIAGNOSIS — M62830 Muscle spasm of back: Secondary | ICD-10-CM | POA: Diagnosis not present

## 2023-01-03 DIAGNOSIS — G8929 Other chronic pain: Secondary | ICD-10-CM | POA: Insufficient documentation

## 2023-01-03 DIAGNOSIS — R293 Abnormal posture: Secondary | ICD-10-CM | POA: Insufficient documentation

## 2023-01-03 DIAGNOSIS — M546 Pain in thoracic spine: Secondary | ICD-10-CM | POA: Insufficient documentation

## 2023-01-03 DIAGNOSIS — M6281 Muscle weakness (generalized): Secondary | ICD-10-CM | POA: Diagnosis not present

## 2023-01-08 ENCOUNTER — Encounter: Payer: Self-pay | Admitting: Physical Therapy

## 2023-01-08 ENCOUNTER — Ambulatory Visit: Payer: 59 | Admitting: Physical Therapy

## 2023-01-08 DIAGNOSIS — M6281 Muscle weakness (generalized): Secondary | ICD-10-CM | POA: Diagnosis not present

## 2023-01-08 DIAGNOSIS — M542 Cervicalgia: Secondary | ICD-10-CM | POA: Diagnosis not present

## 2023-01-08 DIAGNOSIS — M62830 Muscle spasm of back: Secondary | ICD-10-CM | POA: Diagnosis not present

## 2023-01-08 DIAGNOSIS — R293 Abnormal posture: Secondary | ICD-10-CM | POA: Diagnosis not present

## 2023-01-08 DIAGNOSIS — G8929 Other chronic pain: Secondary | ICD-10-CM | POA: Diagnosis not present

## 2023-01-08 DIAGNOSIS — M546 Pain in thoracic spine: Secondary | ICD-10-CM | POA: Diagnosis not present
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
## 2023-01-10 ENCOUNTER — Ambulatory Visit: Payer: 59 | Admitting: Physical Therapy

## 2023-01-10 ENCOUNTER — Encounter: Payer: Self-pay | Admitting: Physical Therapy

## 2023-01-10 ENCOUNTER — Encounter: Payer: Self-pay | Admitting: Family Medicine

## 2023-01-10 DIAGNOSIS — M542 Cervicalgia: Secondary | ICD-10-CM

## 2023-01-10 DIAGNOSIS — M6281 Muscle weakness (generalized): Secondary | ICD-10-CM | POA: Diagnosis not present

## 2023-01-10 DIAGNOSIS — R293 Abnormal posture: Secondary | ICD-10-CM

## 2023-01-10 DIAGNOSIS — E559 Vitamin D deficiency, unspecified: Secondary | ICD-10-CM

## 2023-01-10 DIAGNOSIS — M546 Pain in thoracic spine: Secondary | ICD-10-CM | POA: Diagnosis not present

## 2023-01-10 DIAGNOSIS — M62830 Muscle spasm of back: Secondary | ICD-10-CM | POA: Diagnosis not present

## 2023-01-10 DIAGNOSIS — G8929 Other chronic pain: Secondary | ICD-10-CM | POA: Diagnosis not present
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
## 2023-01-11 ENCOUNTER — Other Ambulatory Visit: Payer: Self-pay

## 2023-01-11 DIAGNOSIS — E559 Vitamin D deficiency, unspecified: Secondary | ICD-10-CM | POA: Diagnosis not present

## 2023-01-12 LAB — VITAMIN D 25 HYDROXY (VIT D DEFICIENCY, FRACTURES): Vit D, 25-Hydroxy: 27 ng/mL — ABNORMAL LOW (ref 30–100)

## 2023-01-16 ENCOUNTER — Encounter: Payer: Self-pay | Admitting: Physical Therapy

## 2023-01-16 ENCOUNTER — Ambulatory Visit: Payer: 59 | Admitting: Physical Therapy

## 2023-01-16 DIAGNOSIS — M542 Cervicalgia: Secondary | ICD-10-CM | POA: Diagnosis not present

## 2023-01-16 DIAGNOSIS — M62830 Muscle spasm of back: Secondary | ICD-10-CM | POA: Diagnosis not present

## 2023-01-16 DIAGNOSIS — R293 Abnormal posture: Secondary | ICD-10-CM

## 2023-01-16 DIAGNOSIS — G8929 Other chronic pain: Secondary | ICD-10-CM | POA: Diagnosis not present

## 2023-01-16 DIAGNOSIS — M6281 Muscle weakness (generalized): Secondary | ICD-10-CM | POA: Diagnosis not present

## 2023-01-16 DIAGNOSIS — M546 Pain in thoracic spine: Secondary | ICD-10-CM | POA: Diagnosis not present
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
## 2023-01-20 ENCOUNTER — Other Ambulatory Visit: Payer: Self-pay | Admitting: Family Medicine

## 2023-01-20 DIAGNOSIS — Z87892 Personal history of anaphylaxis: Secondary | ICD-10-CM

## 2023-01-22 ENCOUNTER — Other Ambulatory Visit: Payer: Self-pay

## 2023-01-22 ENCOUNTER — Encounter: Payer: Self-pay | Admitting: Physical Therapy

## 2023-01-22 ENCOUNTER — Ambulatory Visit: Payer: 59 | Admitting: Physical Therapy

## 2023-01-22 ENCOUNTER — Other Ambulatory Visit: Payer: Self-pay | Admitting: Family Medicine

## 2023-01-22 DIAGNOSIS — M6281 Muscle weakness (generalized): Secondary | ICD-10-CM

## 2023-01-22 DIAGNOSIS — R293 Abnormal posture: Secondary | ICD-10-CM

## 2023-01-22 DIAGNOSIS — M542 Cervicalgia: Secondary | ICD-10-CM | POA: Diagnosis not present

## 2023-01-22 DIAGNOSIS — Z87892 Personal history of anaphylaxis: Secondary | ICD-10-CM

## 2023-01-22 DIAGNOSIS — G8929 Other chronic pain: Secondary | ICD-10-CM | POA: Diagnosis not present

## 2023-01-22 DIAGNOSIS — M546 Pain in thoracic spine: Secondary | ICD-10-CM | POA: Diagnosis not present

## 2023-01-22 DIAGNOSIS — M62830 Muscle spasm of back: Secondary | ICD-10-CM | POA: Diagnosis not present

## 2023-01-23 ENCOUNTER — Other Ambulatory Visit: Payer: Self-pay

## 2023-01-26 ENCOUNTER — Other Ambulatory Visit: Payer: Self-pay

## 2023-01-26 MED FILL — Epinephrine Solution Auto-injector 0.3 MG/0.3ML (1:1000): INTRAMUSCULAR | 30 days supply | Qty: 2 | Fill #0 | Status: AC

## 2023-01-26 NOTE — Telephone Encounter (Signed)
 Requested Prescriptions  Pending Prescriptions Disp Refills   EPINEPHRINE  0.3 mg/0.3 mL IJ SOAJ injection [Pharmacy Med Name: EPINEPHrine  (EPI-PEN) 0.3 mg/0.3 mL Solution Auto-injector injection] 2 each 1    Sig: use one auto-injector into outer thigh as needed for  anaphylaxis     Immunology: Antidotes Passed - 01/26/2023  2:37 PM      Passed - Valid encounter within last 12 months    Recent Outpatient Visits           1 month ago Muscle spasm of back   [facility] [place], Marsa PARAS, DO   2 months ago Annual physical exam   [facility] [place] Marsa PARAS, DO   4 months ago COVID-19 virus infection   [facility] [place] Marsa PARAS, DO   9 months ago Internal derangement of left knee   [facility], Selinda PARAS, MD   9 months ago Muscle spasm of back   [facility] [place], [place]a PARAS, [place]

## 2023-01-28 ENCOUNTER — Other Ambulatory Visit: Payer: Self-pay

## 2023-01-30 ENCOUNTER — Ambulatory Visit: Payer: Commercial Managed Care - PPO | Attending: Family Medicine | Admitting: Physical Therapy

## 2023-01-30 DIAGNOSIS — M542 Cervicalgia: Secondary | ICD-10-CM | POA: Insufficient documentation

## 2023-01-30 DIAGNOSIS — R293 Abnormal posture: Secondary | ICD-10-CM | POA: Insufficient documentation

## 2023-01-30 DIAGNOSIS — M6281 Muscle weakness (generalized): Secondary | ICD-10-CM | POA: Diagnosis not present
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
## 2023-02-01 ENCOUNTER — Encounter: Payer: Self-pay | Admitting: Physical Therapy

## 2023-02-01 ENCOUNTER — Ambulatory Visit: Payer: Commercial Managed Care - PPO | Admitting: Physical Therapy

## 2023-02-01 DIAGNOSIS — M6281 Muscle weakness (generalized): Secondary | ICD-10-CM

## 2023-02-01 DIAGNOSIS — R293 Abnormal posture: Secondary | ICD-10-CM

## 2023-02-01 DIAGNOSIS — M542 Cervicalgia: Secondary | ICD-10-CM
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
## 2023-02-06 ENCOUNTER — Encounter: Payer: Self-pay | Admitting: Physical Therapy

## 2023-02-06 ENCOUNTER — Ambulatory Visit: Payer: Commercial Managed Care - PPO | Admitting: Physical Therapy

## 2023-02-06 DIAGNOSIS — R293 Abnormal posture: Secondary | ICD-10-CM

## 2023-02-06 DIAGNOSIS — M6281 Muscle weakness (generalized): Secondary | ICD-10-CM | POA: Diagnosis not present

## 2023-02-06 DIAGNOSIS — M542 Cervicalgia: Secondary | ICD-10-CM
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
## 2023-02-08 ENCOUNTER — Encounter: Payer: Self-pay | Admitting: Physical Therapy

## 2023-02-08 ENCOUNTER — Ambulatory Visit: Payer: Commercial Managed Care - PPO | Admitting: Physical Therapy

## 2023-02-08 DIAGNOSIS — R293 Abnormal posture: Secondary | ICD-10-CM

## 2023-02-08 DIAGNOSIS — M542 Cervicalgia: Secondary | ICD-10-CM | POA: Diagnosis not present

## 2023-02-08 DIAGNOSIS — M6281 Muscle weakness (generalized): Secondary | ICD-10-CM
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
## 2023-02-13 ENCOUNTER — Encounter: Payer: Self-pay | Admitting: Physical Therapy

## 2023-02-13 ENCOUNTER — Other Ambulatory Visit: Admission: RE | Admit: 2023-02-13 | Payer: Commercial Managed Care - PPO | Source: Home / Self Care

## 2023-02-13 ENCOUNTER — Ambulatory Visit (INDEPENDENT_AMBULATORY_CARE_PROVIDER_SITE_OTHER): Payer: Commercial Managed Care - PPO | Admitting: Family Medicine

## 2023-02-13 ENCOUNTER — Encounter: Payer: Self-pay | Admitting: Family Medicine

## 2023-02-13 ENCOUNTER — Other Ambulatory Visit: Payer: Self-pay

## 2023-02-13 ENCOUNTER — Ambulatory Visit
Admission: RE | Admit: 2023-02-13 | Discharge: 2023-02-13 | Disposition: A | Discharge status: Home / Self Care | Payer: Commercial Managed Care - PPO | Attending: Family Medicine | Admitting: Family Medicine

## 2023-02-13 ENCOUNTER — Ambulatory Visit: Payer: Commercial Managed Care - PPO | Admitting: Physical Therapy

## 2023-02-13 ENCOUNTER — Ambulatory Visit
Admission: RE | Admit: 2023-02-13 | Discharge: 2023-02-13 | Disposition: A | Discharge status: Home / Self Care | Payer: Commercial Managed Care - PPO | Source: Ambulatory Visit | Attending: Family Medicine

## 2023-02-13 VITALS — BP 116/74 | HR 83 | Ht 70.0 in | Wt 267.6 lb

## 2023-02-13 DIAGNOSIS — M5412 Radiculopathy, cervical region: Secondary | ICD-10-CM | POA: Diagnosis not present

## 2023-02-13 DIAGNOSIS — M4802 Spinal stenosis, cervical region: Secondary | ICD-10-CM | POA: Diagnosis not present

## 2023-02-13 DIAGNOSIS — M5410 Radiculopathy, site unspecified: Secondary | ICD-10-CM

## 2023-02-13 DIAGNOSIS — G8929 Other chronic pain: Secondary | ICD-10-CM | POA: Diagnosis not present

## 2023-02-13 DIAGNOSIS — M50322 Other cervical disc degeneration at C5-C6 level: Secondary | ICD-10-CM | POA: Diagnosis not present

## 2023-02-13 DIAGNOSIS — M2550 Pain in unspecified joint: Secondary | ICD-10-CM

## 2023-02-13 MED ORDER — DICLOFENAC SODIUM 75 MG PO TBEC
75.0000 mg | DELAYED_RELEASE_TABLET | Freq: Two times a day (BID) | ORAL | 0 refills | Status: DC | PRN
Start: 2023-02-13 — End: 2023-09-10
  Filled 2023-02-13: qty 60, 30d supply, fill #0

## 2023-02-13 NOTE — Telephone Encounter (Signed)
Please review

## 2023-02-14 DIAGNOSIS — M2550 Pain in unspecified joint: Secondary | ICD-10-CM | POA: Insufficient documentation

## 2023-02-14 DIAGNOSIS — M5412 Radiculopathy, cervical region: Secondary | ICD-10-CM | POA: Insufficient documentation

## 2023-02-14 NOTE — Patient Instructions (Signed)
-  Order autoimmune function labs today. -Order cervical spine x-rays today. -Schedule MRIs of the cervical and lumbar spines -Continue physical therapy, focusing on pain control as needed. -Consider use of modalities such as heat, manual therapy, and electrical stimulation during physical therapy sessions. -Prescribe Diclofenac twice a day as needed for pain control. Advise to take with food.

## 2023-02-14 NOTE — Telephone Encounter (Signed)
FYI  KP

## 2023-02-14 NOTE — Assessment & Plan Note (Signed)
History of Present Illness The patient, with a history of primarily lumbosacral pain, has been undergoing physical therapy for the past eight months. During that process cervicalgia became evident and has been worked on, The patient reports that the pain, particularly in the back, has been worsening. The pain is severe enough to disrupt sleep, especially when sleeping on the stomach. The patient has been taking various medications for pain relief, including muscle relaxers and anti-inflammatories, with varying degrees of success. The patient reports feeling stronger and seeing more range and movement since starting physical therapy, but the recent increase in pain has been concerning. The patient denies any radiating symptoms down the legs or arms, and no numbness in any region.  Physical Exam -SLR, -Spurling's, equivocal Kemp's  Assessment and Plan Spinal Pain Diffuse pain with recent exacerbation in the cervical and lumbosacral regions. No neurological symptoms. Currently undergoing physical therapy. X-rays of the luimbar spine are normal. Discussed the possibility of additional etiologies -Order autoimmune function labs today. -Order cervical spine x-rays today. -Schedule MRIs of the cervical and lumbar spines -Continue physical therapy, focusing on pain control as needed. -Consider use of modalities such as heat, manual therapy, and electrical stimulation during physical therapy sessions. -Prescribe Diclofenac twice a day as needed for pain control. Advise to take with food.

## 2023-02-14 NOTE — Progress Notes (Signed)
Primary Care / Sports Medicine Office Visit  Patient Information:  Patient ID: Valerie Martinez, female DOB: 2000-06-23 Age: 23 y.o. MRN: [id number]   Valerie Martinez is a pleasant 23 y.o. female presenting with the following:  Chief Complaint  Patient presents with   Back Pain    Patient has been going to PT for her back pain but this morning her symptoms are worse. Patient states her symptoms are worse in the morning. She has been using bio freeze, aleve and tylenol for pain. NO weakness, numbness, or tingling. NKI    Vitals:   02/13/23 1321  BP: 116/74  Pulse: 83  SpO2: 96%   Vitals:   02/13/23 1321  Weight: 267 lb 9.6 oz (121.4 kg)  Height: 5\' 10"  (1.778 m)   Body mass index is 38.4 kg/m.  No results found.   Independent interpretation of notes and tests performed by another provider:   None  Procedures performed:   None  Pertinent History, Exam, Impression, and Recommendations:   Problem List Items Addressed This Visit     Cervical radiculitis   Relevant Orders   MR Cervical Spine Wo Contrast   Polyarthralgia - Primary   History of Present Illness The patient, with a history of primarily lumbosacral pain, has been undergoing physical therapy for the past eight months. During that process cervicalgia became evident and has been worked on, The patient reports that the pain, particularly in the back, has been worsening. The pain is severe enough to disrupt sleep, especially when sleeping on the stomach. The patient has been taking various medications for pain relief, including muscle relaxers and anti-inflammatories, with varying degrees of success. The patient reports feeling stronger and seeing more range and movement since starting physical therapy, but the recent increase in pain has been concerning. The patient denies any radiating symptoms down the legs or arms, and no numbness in any region.  Physical Exam -SLR, -Spurling's, equivocal Kemp's  Assessment  and Plan Spinal Pain Diffuse pain with recent exacerbation in the cervical and lumbosacral regions. No neurological symptoms. Currently undergoing physical therapy. X-rays of the luimbar spine are normal. Discussed the possibility of additional etiologies -Order autoimmune function labs today. -Order cervical spine x-rays today. -Schedule MRIs of the cervical and lumbar spines -Continue physical therapy, focusing on pain control as needed. -Consider use of modalities such as heat, manual therapy, and electrical stimulation during physical therapy sessions. -Prescribe Diclofenac twice a day as needed for pain control. Advise to take with food.      Relevant Medications   diclofenac (VOLTAREN) 75 MG EC tablet   Other Relevant Orders   ANA 12Plus Profile, Do All RDL   DG Cervical Spine Complete   MR Cervical Spine Wo Contrast   MR Lumbar Spine Wo Contrast   Radicular syndrome of left leg   Relevant Orders   MR Lumbar Spine Wo Contrast   I provided a total time of 43 minutes including both face-to-face and non-face-to-face time on 02/14/2023 inclusive of time utilized for medical chart review, information gathering, care coordination with staff, and documentation completion.   Orders & Medications Medications:  Meds ordered this encounter  Medications   diclofenac (VOLTAREN) 75 MG EC tablet    Sig: Take 1 tablet (75 mg total) by mouth 2 (two) times daily as needed.    Dispense:  60 tablet    Refill:  0   Orders Placed This Encounter  Procedures   DG Cervical Spine Complete  MR Cervical Spine Wo Contrast   MR Lumbar Spine Wo Contrast   ANA 12Plus Profile, Do All RDL     No follow-ups on file.     Jerrol Banana, MD, [facility]   Primary Care Sports Medicine Primary Care and Sports Medicine at [facility]

## 2023-02-15 ENCOUNTER — Ambulatory Visit: Payer: Commercial Managed Care - PPO | Admitting: Physical Therapy

## 2023-02-15 ENCOUNTER — Encounter: Payer: Self-pay | Admitting: Family Medicine

## 2023-02-15 ENCOUNTER — Encounter: Payer: Self-pay | Admitting: Physical Therapy

## 2023-02-16 ENCOUNTER — Ambulatory Visit
Admission: RE | Admit: 2023-02-16 | Discharge: 2023-02-16 | Disposition: A | Discharge status: Home / Self Care | Payer: Commercial Managed Care - PPO | Source: Ambulatory Visit | Attending: Family Medicine | Admitting: Family Medicine

## 2023-02-16 DIAGNOSIS — M5410 Radiculopathy, site unspecified: Secondary | ICD-10-CM | POA: Diagnosis not present

## 2023-02-16 DIAGNOSIS — M50221 Other cervical disc displacement at C4-C5 level: Secondary | ICD-10-CM | POA: Diagnosis not present

## 2023-02-16 DIAGNOSIS — M5412 Radiculopathy, cervical region: Secondary | ICD-10-CM | POA: Diagnosis not present

## 2023-02-16 DIAGNOSIS — M545 Low back pain, unspecified: Secondary | ICD-10-CM | POA: Diagnosis not present

## 2023-02-16 DIAGNOSIS — M50321 Other cervical disc degeneration at C4-C5 level: Secondary | ICD-10-CM | POA: Diagnosis not present

## 2023-02-16 DIAGNOSIS — M2550 Pain in unspecified joint: Secondary | ICD-10-CM | POA: Diagnosis not present

## 2023-02-16 DIAGNOSIS — M47812 Spondylosis without myelopathy or radiculopathy, cervical region: Secondary | ICD-10-CM | POA: Diagnosis not present

## 2023-02-16 DIAGNOSIS — M50222 Other cervical disc displacement at C5-C6 level: Secondary | ICD-10-CM | POA: Diagnosis not present

## 2023-02-17 ENCOUNTER — Other Ambulatory Visit: Payer: Self-pay | Admitting: Family Medicine

## 2023-02-17 DIAGNOSIS — K219 Gastro-esophageal reflux disease without esophagitis: Secondary | ICD-10-CM

## 2023-02-18 ENCOUNTER — Other Ambulatory Visit: Payer: Self-pay

## 2023-02-18 ENCOUNTER — Other Ambulatory Visit: Payer: Self-pay | Admitting: Family Medicine

## 2023-02-18 DIAGNOSIS — K219 Gastro-esophageal reflux disease without esophagitis: Secondary | ICD-10-CM

## 2023-02-19 ENCOUNTER — Encounter: Payer: Self-pay | Admitting: Physical Therapy

## 2023-02-20 ENCOUNTER — Other Ambulatory Visit: Payer: Self-pay

## 2023-02-20 ENCOUNTER — Encounter: Payer: Self-pay | Admitting: Family Medicine

## 2023-02-20 ENCOUNTER — Ambulatory Visit: Payer: Commercial Managed Care - PPO | Admitting: Physical Therapy

## 2023-02-20 ENCOUNTER — Other Ambulatory Visit: Payer: Self-pay | Admitting: Family Medicine

## 2023-02-20 DIAGNOSIS — N760 Acute vaginitis: Secondary | ICD-10-CM | POA: Diagnosis not present

## 2023-02-20 DIAGNOSIS — R102 Pelvic and perineal pain: Secondary | ICD-10-CM | POA: Diagnosis not present

## 2023-02-20 DIAGNOSIS — M2550 Pain in unspecified joint: Secondary | ICD-10-CM

## 2023-02-20 DIAGNOSIS — R350 Frequency of micturition: Secondary | ICD-10-CM | POA: Diagnosis not present

## 2023-02-20 LAB — ANA 12PLUS PROFILE, DO ALL RDL
Anti-CCP Ab, IgG & IgA (RDL): <20 U (ref <20)
Anti-Cardiolipin Ab, IgA (RDL): <12 [APL'U]/mL (ref <12)
Anti-Cardiolipin Ab, IgG (RDL): <15 [GPL'U]/mL (ref <15)
Anti-Cardiolipin Ab, IgM (RDL): <13 [MPL'U]/mL (ref <13)
Anti-Centromere Ab (RDL): <1:40 {titer}
Anti-Chromatin Ab, IgG (RDL): <20 U (ref <20)
Anti-La (SS-B) Ab (RDL): <20 U (ref <20)
Anti-Nuclear Ab by IFA (RDL): POSITIVE — AB
Anti-Ro (SS-A) Ab (RDL): <20 U (ref <20)
Anti-Scl-70 Ab (RDL): <20 U (ref <20)
Anti-Sm Ab (RDL): <20 U (ref <20)
Anti-TPO Ab (RDL): <9 [IU]/mL (ref <9.0)
Anti-U1 RNP Ab (RDL): 75 U — ABNORMAL HIGH (ref <20)
Anti-dsDNA Ab by Farr(RDL): <8 [IU]/mL (ref <8.0)
C3 Complement (RDL): 165 mg/dL (ref 90–180)
C4 Complement (RDL): 34 mg/dL (ref 14–44)
Rheumatoid Factor by Turb RDL: <14 [IU]/mL (ref <14)

## 2023-02-20 LAB — ANA TITER AND PATTERN: Speckled Pattern: 1:160 {titer} — ABNORMAL HIGH

## 2023-02-20 MED ORDER — ONDANSETRON 4 MG PO TBDP
4.0000 mg | ORAL_TABLET | Freq: Three times a day (TID) | ORAL | 0 refills | Status: DC | PRN
  Filled 2023-02-20: qty 20, 7d supply, fill #0

## 2023-02-20 MED FILL — Pantoprazole Sodium EC Tab 40 MG (Base Equiv): ORAL | 90 days supply | Qty: 180 | Fill #0 | Status: AC

## 2023-02-20 NOTE — Telephone Encounter (Signed)
Requested Prescriptions  Pending Prescriptions Disp Refills   pantoprazole (PROTONIX) 40 MG tablet 180 tablet 1    Sig: Take 1 tablet (40 mg total) by mouth 2 (two) times daily before a meal.     Gastroenterology: Proton Pump Inhibitors Passed - 02/20/2023  4:36 PM      Passed - Valid encounter within last 12 months    Recent Outpatient Visits           1 week ago Polyarthralgia   [facility], Ocie Bob, MD   2 months ago Muscle spasm of back   [facility], Netta Neat, DO   3 months ago Annual physical exam   [facility] Smitty Cords, DO   5 months ago COVID-19 virus infection   [facility] Smitty Cords, DO   9 months ago Internal derangement of left knee   [facility] Primary Care & [facility] Medicine at [facility], Ocie Bob, MD

## 2023-02-22 ENCOUNTER — Other Ambulatory Visit: Payer: Self-pay

## 2023-02-22 ENCOUNTER — Encounter: Payer: 59 | Admitting: Physical Therapy

## 2023-02-26 ENCOUNTER — Other Ambulatory Visit: Payer: Self-pay

## 2023-02-27 ENCOUNTER — Ambulatory Visit: Payer: Commercial Managed Care - PPO | Attending: Family Medicine | Admitting: Physical Therapy

## 2023-02-27 ENCOUNTER — Encounter: Payer: Self-pay | Admitting: Physical Therapy

## 2023-02-27 DIAGNOSIS — R293 Abnormal posture: Secondary | ICD-10-CM | POA: Diagnosis not present

## 2023-02-27 DIAGNOSIS — M6281 Muscle weakness (generalized): Secondary | ICD-10-CM

## 2023-02-27 DIAGNOSIS — M542 Cervicalgia: Secondary | ICD-10-CM
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
## 2023-03-13 DIAGNOSIS — R102 Pelvic and perineal pain: Secondary | ICD-10-CM | POA: Diagnosis not present

## 2023-03-13 DIAGNOSIS — N83201 Unspecified ovarian cyst, right side: Secondary | ICD-10-CM | POA: Diagnosis not present

## 2023-03-13 DIAGNOSIS — D259 Leiomyoma of uterus, unspecified: Secondary | ICD-10-CM | POA: Diagnosis not present

## 2023-03-14 ENCOUNTER — Other Ambulatory Visit: Payer: Self-pay | Admitting: Medical Genetics

## 2023-03-19 ENCOUNTER — Other Ambulatory Visit: Payer: Commercial Managed Care - PPO

## 2023-03-20 ENCOUNTER — Other Ambulatory Visit: Payer: Self-pay

## 2023-03-20 DIAGNOSIS — J3089 Other allergic rhinitis: Secondary | ICD-10-CM | POA: Diagnosis not present

## 2023-03-20 DIAGNOSIS — J3081 Allergic rhinitis due to animal (cat) (dog) hair and dander: Secondary | ICD-10-CM | POA: Diagnosis not present

## 2023-03-20 DIAGNOSIS — J45991 Cough variant asthma: Secondary | ICD-10-CM | POA: Diagnosis not present

## 2023-03-20 DIAGNOSIS — J301 Allergic rhinitis due to pollen: Secondary | ICD-10-CM | POA: Diagnosis not present

## 2023-03-20 MED ORDER — EPINEPHRINE 0.3 MG/0.3ML IJ SOAJ
0.3000 mg | INTRAMUSCULAR | 1 refills | Status: AC | PRN
  Filled 2023-03-20: qty 4, 18d supply, fill #0

## 2023-03-20 MED ORDER — BUDESONIDE-FORMOTEROL FUMARATE 80-4.5 MCG/ACT IN AERO
2.0000 | INHALATION_SPRAY | RESPIRATORY_TRACT | 4 refills | Status: AC | PRN
Start: 2023-03-20 — End: ?
  Filled 2023-03-20: qty 10.2, 30d supply, fill #0

## 2023-03-20 MED ORDER — AEROCHAMBER PLS FLOVU MTHPIECE DEVI
1.0000 | 1 refills | Status: AC
Start: 2023-03-20 — End: ?
  Filled 2023-03-20: qty 1, 30d supply, fill #0

## 2023-03-20 MED ORDER — CETIRIZINE HCL 10 MG PO TABS
10.0000 mg | ORAL_TABLET | Freq: Every day | ORAL | 0 refills | Status: DC
  Filled 2023-03-20: qty 30, 30d supply, fill #0

## 2023-03-20 MED ORDER — OLOPATADINE HCL 0.2 % OP SOLN
1.0000 [drp] | Freq: Two times a day (BID) | OPHTHALMIC | 5 refills | Status: AC
  Filled 2023-03-20: qty 2.5, 25d supply, fill #0
  Filled 2023-04-12: qty 2.5, 25d supply, fill #1

## 2023-03-21 ENCOUNTER — Other Ambulatory Visit: Payer: Self-pay

## 2023-03-28 DIAGNOSIS — M2550 Pain in unspecified joint: Secondary | ICD-10-CM | POA: Diagnosis not present

## 2023-03-28 DIAGNOSIS — R768 Other specified abnormal immunological findings in serum: Secondary | ICD-10-CM | POA: Diagnosis not present

## 2023-04-05 DIAGNOSIS — E538 Deficiency of other specified B group vitamins: Secondary | ICD-10-CM | POA: Diagnosis not present

## 2023-04-05 DIAGNOSIS — R768 Other specified abnormal immunological findings in serum: Secondary | ICD-10-CM | POA: Diagnosis not present

## 2023-04-05 DIAGNOSIS — M2550 Pain in unspecified joint: Secondary | ICD-10-CM | POA: Diagnosis not present

## 2023-04-12 ENCOUNTER — Other Ambulatory Visit
Admission: RE | Admit: 2023-04-12 | Discharge: 2023-04-12 | Disposition: A | Discharge status: Home / Self Care | Payer: Self-pay | Source: Ambulatory Visit | Attending: Medical Genetics | Admitting: Medical Genetics

## 2023-04-12 ENCOUNTER — Other Ambulatory Visit: Payer: Self-pay

## 2023-04-12 DIAGNOSIS — R195 Other fecal abnormalities: Secondary | ICD-10-CM | POA: Diagnosis not present

## 2023-04-12 DIAGNOSIS — R6881 Early satiety: Secondary | ICD-10-CM | POA: Diagnosis not present

## 2023-04-12 DIAGNOSIS — K219 Gastro-esophageal reflux disease without esophagitis: Secondary | ICD-10-CM | POA: Diagnosis not present

## 2023-04-12 DIAGNOSIS — R110 Nausea: Secondary | ICD-10-CM | POA: Diagnosis not present

## 2023-04-13 ENCOUNTER — Encounter: Payer: Self-pay | Admitting: Family Medicine

## 2023-04-13 DIAGNOSIS — E538 Deficiency of other specified B group vitamins: Secondary | ICD-10-CM

## 2023-04-16 ENCOUNTER — Other Ambulatory Visit: Payer: Self-pay | Admitting: Gastroenterology

## 2023-04-16 ENCOUNTER — Other Ambulatory Visit: Payer: Self-pay

## 2023-04-16 DIAGNOSIS — K219 Gastro-esophageal reflux disease without esophagitis: Secondary | ICD-10-CM

## 2023-04-16 DIAGNOSIS — R6881 Early satiety: Secondary | ICD-10-CM

## 2023-04-16 DIAGNOSIS — R110 Nausea: Secondary | ICD-10-CM

## 2023-04-16 MED ORDER — LEVOCETIRIZINE DIHYDROCHLORIDE 5 MG PO TABS
5.0000 mg | ORAL_TABLET | Freq: Every evening | ORAL | 5 refills | Status: DC
  Filled 2023-04-16: qty 30, 30d supply, fill #0
  Filled 2023-05-26: qty 30, 30d supply, fill #1
  Filled 2023-07-13: qty 30, 30d supply, fill #2

## 2023-04-17 ENCOUNTER — Ambulatory Visit
Admission: RE | Admit: 2023-04-17 | Discharge: 2023-04-17 | Disposition: A | Discharge status: Home / Self Care | Source: Ambulatory Visit | Attending: Gastroenterology | Admitting: Gastroenterology

## 2023-04-17 DIAGNOSIS — K219 Gastro-esophageal reflux disease without esophagitis: Secondary | ICD-10-CM | POA: Insufficient documentation

## 2023-04-17 DIAGNOSIS — R6881 Early satiety: Secondary | ICD-10-CM | POA: Insufficient documentation

## 2023-04-17 DIAGNOSIS — R110 Nausea: Secondary | ICD-10-CM | POA: Diagnosis not present

## 2023-04-18 MED ORDER — CYANOCOBALAMIN 1000 MCG/ML IJ SOLN
1000.0000 ug | INTRAMUSCULAR | 0 refills | Status: DC
Start: 2023-04-18 — End: 2023-08-27
  Filled 2023-04-18: qty 4, 28d supply, fill #0

## 2023-04-18 NOTE — Addendum Note (Signed)
 Addended by: Smitty Cords on: 04/18/2023 07:06 PM   Modules accepted: Orders

## 2023-04-19 ENCOUNTER — Other Ambulatory Visit: Payer: Self-pay

## 2023-04-24 LAB — GENECONNECT MOLECULAR SCREEN: Genetic Analysis Overall Interpretation: NEGATIVE

## 2023-05-11 ENCOUNTER — Encounter: Payer: Self-pay | Admitting: Family Medicine

## 2023-05-11 ENCOUNTER — Other Ambulatory Visit: Payer: Self-pay

## 2023-05-11 ENCOUNTER — Ambulatory Visit: Admitting: Family Medicine

## 2023-05-11 VITALS — BP 124/80 | HR 78 | Temp 98.6°F | Ht 70.0 in | Wt 271.4 lb

## 2023-05-11 DIAGNOSIS — J029 Acute pharyngitis, unspecified: Secondary | ICD-10-CM | POA: Diagnosis not present

## 2023-05-11 DIAGNOSIS — J3089 Other allergic rhinitis: Secondary | ICD-10-CM | POA: Diagnosis not present

## 2023-05-11 LAB — POCT RAPID STREP A (OFFICE): Rapid Strep A Screen: NEGATIVE

## 2023-05-11 MED ORDER — AZITHROMYCIN 250 MG PO TABS: ORAL_TABLET | ORAL | 0 refills | Status: DC

## 2023-05-11 MED ORDER — AZELASTINE HCL 0.1 % NA SOLN
2.0000 | Freq: Two times a day (BID) | NASAL | 5 refills | Status: AC
  Filled 2023-05-11: qty 30, 25d supply, fill #0

## 2023-05-11 MED ORDER — IPRATROPIUM BROMIDE 0.06 % NA SOLN
2.0000 | Freq: Four times a day (QID) | NASAL | 0 refills | Status: DC
  Filled 2023-05-11: qty 15, 19d supply, fill #0

## 2023-05-11 NOTE — Patient Instructions (Addendum)
 Thank you for coming to the office today.  Likely symptoms of sore throat due to post nasal drainage allergies Start Azelastine  allergy  anti histamine nasal 2 spray each  nostril twice a day for now Back up plan if need short term for congestion - Start Atrovent  nasal spray decongestant 2 sprays in each nostril up to 4 times daily for 7 days or less  Back up plan again with antibiotic if not improving - printed Azithromycin  Z pak (antibiotic) 2 tabs day 1, then 1 tab x 4 days, complete entire course even if improved     Please schedule a Follow-up Appointment to: Return if symptoms worsen or fail to improve.  If you have any other questions or concerns, please feel free to call the office or send a message through MyChart. You may also schedule an earlier appointment if necessary.  Additionally, you may be receiving a survey about your experience at our office within a few days to 1 week by e-mail or mail. We value your feedback.  Marsa Officer, DO [facility], [place]

## 2023-05-11 NOTE — Progress Notes (Signed)
 Subjective:    Patient ID: Valerie Martinez, female    DOB: [birth year], 23 y.o.   MRN: [id number]  Valerie Martinez is a 23 y.o. female presenting on 05/11/2023 for Sore Throat and Allergies   HPI  Discussed the use of AI scribe software for clinical note transcription with the patient, who gave verbal consent to proceed.  History of Present Illness   Valerie Martinez is a 23 year old female who presents with a persistent sore throat and allergy  symptoms.  She has been experiencing a sore throat for approximately two to three weeks, initially noticeable at night or early morning, but now persisting throughout the day. She initially attributed the symptoms to allergies.  In addition to the sore throat, she has developed a runny nose, congestion at night, and sneezing. She experiences difficulty sleeping due to throat pain and nasal drainage, disrupting her sleep schedule. Significant sinus congestion at night often causes her to wake up unable to breathe properly. Symptoms seem to worsen when her medication wears off.  She has a history of allergy  management and recently changed her medication regimen. Initially prescribed Zyrtec  and Allegra, she switched to Xyzal  after consulting with her allergist. Despite these changes, her symptoms have persisted. She has not been using nasal sprays due to potential side effects like epistaxis and discomfort, although she has previously used Flonase.  She experiences body aches and was previously evaluated for lupus, which was initially positive but later negative upon further testing. She is uncertain if her current symptoms are related to a cold or allergies. - Followed by Rheumatology and Sports Med  Her current medications include Levemir and Xyzal  at night. She has previously used Claritin and Allegra. She uses eye drops twice daily due to severe eye irritation when outdoors.          12/19/2022   11:00 AM 11/14/2022    9:09 AM 09/19/2022   11:27  AM  Depression screen PHQ 2/9  Decreased Interest 0 1 0  Down, Depressed, Hopeless 0 1 0  PHQ - 2 Score 0 2 0  Altered sleeping 1 2 1   Tired, decreased energy 1 1 0  Change in appetite 0 1 1  Feeling bad or failure about yourself  0 1 0  Trouble concentrating 0 0 0  Moving slowly or fidgety/restless 0 0 0  Suicidal thoughts 0 0 0  PHQ-9 Score 2 7 2   Difficult doing work/chores Not difficult at all Somewhat difficult Not difficult at all       12/19/2022   11:00 AM 11/14/2022    9:09 AM 09/19/2022   11:27 AM 11/25/2021   10:37 AM  GAD 7 : Generalized Anxiety Score  Nervous, Anxious, on Edge 0 0 0 0  Control/stop worrying 0 1 0 0  Worry too much - different things 0 1 0 0  Trouble relaxing 0 0 1 1  Restless 0 0 0 0  Easily annoyed or irritable 1 1 0 0  Afraid - awful might happen 0 0 0 0  Total GAD 7 Score 1 3 1 1   Anxiety Difficulty Not difficult at all  Not difficult at all Somewhat difficult    Social History   Tobacco Use   Smoking status: Never   Smokeless tobacco: Never  Vaping Use   Vaping status: Never Used  Substance Use Topics   Alcohol use: Never   Drug use: Never    Review of Systems Per HPI unless specifically indicated above  Objective:    BP 124/80 (BP Location: Right Arm, Patient Position: Sitting, Cuff Size: Large)   Pulse 78   Temp 98.6 F (37 C) (Oral)   Ht 5' 10 (1.778 m)   Wt 271 lb 6 oz (123.1 kg)   SpO2 99%   BMI 38.94 kg/m   Wt Readings from Last 3 Encounters:  05/11/23 271 lb 6 oz (123.1 kg)  02/13/23 267 lb 9.6 oz (121.4 kg)  12/19/22 270 lb 6.4 oz (122.7 kg)    Physical Exam Vitals and nursing note reviewed.  Constitutional:      General: She is not in acute distress.    Appearance: Normal appearance. She is well-developed. She is not diaphoretic.     Comments: Well-appearing, comfortable, cooperative  HENT:     Head: Normocephalic and atraumatic.     Right Ear: Tympanic membrane, ear canal and external ear normal.  No drainage. There is no impacted cerumen.     Left Ear: Tympanic membrane, ear canal and external ear normal. No drainage. There is no impacted cerumen.     Nose: Congestion present.     Mouth/Throat:     Pharynx: No oropharyngeal exudate or posterior oropharyngeal erythema.     Comments: No erythema. Some mild chronic edema bilateral tonsils. No exudates. No asymmetry. Noted post nasal drainage Eyes:     General:        Right eye: No discharge.        Left eye: No discharge.     Conjunctiva/sclera: Conjunctivae normal.  Cardiovascular:     Rate and Rhythm: Normal rate.  Pulmonary:     Effort: Pulmonary effort is normal.  Skin:    General: Skin is warm and dry.     Findings: No erythema or rash.  Neurological:     Mental Status: She is alert and oriented to person, place, and time.  Psychiatric:        Mood and Affect: Mood normal.        Behavior: Behavior normal.        Thought Content: Thought content normal.     Comments: Well groomed, good eye contact, normal speech and thoughts     Results for orders placed or performed in visit on 05/11/23  POCT rapid strep A   Collection Time: 05/11/23  9:58 AM  Result Value Ref Range   Rapid Strep A Screen Negative Negative      Assessment & Plan:   Problem List Items Addressed This Visit   None Visit Diagnoses       Sore throat    -  Primary   Relevant Medications   azithromycin  (ZITHROMAX  Z-PAK) 250 MG tablet   Other Relevant Orders   POCT rapid strep A (Completed)     Seasonal allergic rhinitis due to other allergic trigger       Relevant Medications   azelastine  (ASTELIN ) 0.1 % nasal spray   ipratropium (ATROVENT ) 0.06 % nasal spray        Chronic Allergic Rhinitis Followed by [facility] specialist, seen recently but prior to onset of these symptoms  No obvious sign of bacterial infection on exam today.  Rapid strep negative Sore throat likely from postnasal drip allergies. Symptoms include  rhinorrhea, congestion, sneezing, and nocturnal breathing difficulties. Limited success with various antihistamines. No infection signs; antibiotics not needed. B  Continue oral anti histamine regimen per Allergist Allegra + Xyzal  - Order azelastine  nasal spray, two sprays twice daily. -Only AS NEEDED for  congestion - ipratropium nasal spray, two sprays in both nostrils up to four times daily as needed.  - Provide backup prescription for azithromycin  if symptoms do not improve. Printed, not need to fill yet.    Orders Placed This Encounter  Procedures   POCT rapid strep A    Meds ordered this encounter  Medications   azelastine  (ASTELIN ) 0.1 % nasal spray    Sig: Place 2 sprays into both nostrils 2 (two) times daily. Use in each nostril as directed. For allergy  prevention.    Dispense:  30 mL    Refill:  5   ipratropium (ATROVENT ) 0.06 % nasal spray    Sig: Place 2 sprays into both nostrils 4 (four) times daily. As needed short term for congestion    Dispense:  15 mL    Refill:  0   azithromycin  (ZITHROMAX  Z-PAK) 250 MG tablet    Sig: Take 2 tabs (500mg  total) on Day 1. Take 1 tab (250mg ) daily for next 4 days.    Dispense:  6 tablet    Refill:  0    Follow up plan: Return if symptoms worsen or fail to improve.   Marsa Officer, DO [facility] 05/11/2023, 9:43 AM

## 2023-05-12 MED FILL — Pantoprazole Sodium EC Tab 40 MG (Base Equiv): ORAL | 90 days supply | Qty: 180 | Fill #1 | Status: AC

## 2023-05-15 ENCOUNTER — Encounter: Payer: Self-pay | Admitting: Family Medicine

## 2023-05-22 ENCOUNTER — Telehealth: Payer: Self-pay

## 2023-05-22 NOTE — Telephone Encounter (Signed)
 LMOM for patient to call back in regards to getting MRI's on disc for her chiropractor.   JM

## 2023-05-22 NOTE — Telephone Encounter (Signed)
 Copied from CRM [id number]. Topic: Clinical - Lab/Test Results >> May 22, 2023  8:04 AM Oddis Bench wrote: Reason for CRM: Patient is calling bc she has a appt scheduled for chiropractor [facility] and the Dr. Seleta Dakins is requesting a copy of the last mri taken for the patient in January. Phone number [phone]

## 2023-05-22 NOTE — Telephone Encounter (Signed)
 Patient needs to go to imaging facility to get copy of MRI'S on disc for her chiropractor appt.

## 2023-05-23 DIAGNOSIS — M542 Cervicalgia: Secondary | ICD-10-CM | POA: Diagnosis not present

## 2023-05-23 DIAGNOSIS — M9903 Segmental and somatic dysfunction of lumbar region: Secondary | ICD-10-CM | POA: Diagnosis not present

## 2023-05-23 DIAGNOSIS — M9901 Segmental and somatic dysfunction of cervical region: Secondary | ICD-10-CM | POA: Diagnosis not present

## 2023-05-23 DIAGNOSIS — M545 Low back pain, unspecified: Secondary | ICD-10-CM | POA: Diagnosis not present

## 2023-05-23 DIAGNOSIS — M546 Pain in thoracic spine: Secondary | ICD-10-CM | POA: Diagnosis not present

## 2023-05-23 DIAGNOSIS — M9902 Segmental and somatic dysfunction of thoracic region: Secondary | ICD-10-CM | POA: Diagnosis not present

## 2023-05-25 DIAGNOSIS — M546 Pain in thoracic spine: Secondary | ICD-10-CM | POA: Diagnosis not present

## 2023-05-25 DIAGNOSIS — M9902 Segmental and somatic dysfunction of thoracic region: Secondary | ICD-10-CM | POA: Diagnosis not present

## 2023-05-25 DIAGNOSIS — M542 Cervicalgia: Secondary | ICD-10-CM | POA: Diagnosis not present

## 2023-05-25 DIAGNOSIS — M9901 Segmental and somatic dysfunction of cervical region: Secondary | ICD-10-CM | POA: Diagnosis not present

## 2023-05-25 DIAGNOSIS — M545 Low back pain, unspecified: Secondary | ICD-10-CM | POA: Diagnosis not present

## 2023-05-25 DIAGNOSIS — M9903 Segmental and somatic dysfunction of lumbar region: Secondary | ICD-10-CM | POA: Diagnosis not present

## 2023-05-28 DIAGNOSIS — M9903 Segmental and somatic dysfunction of lumbar region: Secondary | ICD-10-CM | POA: Diagnosis not present

## 2023-05-28 DIAGNOSIS — M9902 Segmental and somatic dysfunction of thoracic region: Secondary | ICD-10-CM | POA: Diagnosis not present

## 2023-05-28 DIAGNOSIS — M9901 Segmental and somatic dysfunction of cervical region: Secondary | ICD-10-CM | POA: Diagnosis not present

## 2023-05-28 DIAGNOSIS — M545 Low back pain, unspecified: Secondary | ICD-10-CM | POA: Diagnosis not present

## 2023-05-28 DIAGNOSIS — M542 Cervicalgia: Secondary | ICD-10-CM | POA: Diagnosis not present

## 2023-05-28 DIAGNOSIS — M546 Pain in thoracic spine: Secondary | ICD-10-CM | POA: Diagnosis not present

## 2023-05-30 DIAGNOSIS — M542 Cervicalgia: Secondary | ICD-10-CM | POA: Diagnosis not present

## 2023-05-30 DIAGNOSIS — M546 Pain in thoracic spine: Secondary | ICD-10-CM | POA: Diagnosis not present

## 2023-05-30 DIAGNOSIS — M9903 Segmental and somatic dysfunction of lumbar region: Secondary | ICD-10-CM | POA: Diagnosis not present

## 2023-05-30 DIAGNOSIS — M545 Low back pain, unspecified: Secondary | ICD-10-CM | POA: Diagnosis not present

## 2023-05-30 DIAGNOSIS — M9901 Segmental and somatic dysfunction of cervical region: Secondary | ICD-10-CM | POA: Diagnosis not present

## 2023-05-30 DIAGNOSIS — M9902 Segmental and somatic dysfunction of thoracic region: Secondary | ICD-10-CM | POA: Diagnosis not present

## 2023-06-25 ENCOUNTER — Encounter: Payer: Self-pay | Admitting: Family Medicine

## 2023-07-05 ENCOUNTER — Other Ambulatory Visit: Payer: Self-pay

## 2023-07-05 DIAGNOSIS — F3281 Premenstrual dysphoric disorder: Secondary | ICD-10-CM | POA: Diagnosis not present

## 2023-07-05 MED ORDER — DROSPIRENONE-ETHINYL ESTRADIOL 3-0.02 MG PO TABS
1.0000 | ORAL_TABLET | Freq: Every day | ORAL | 11 refills | Status: DC
  Filled 2023-07-05: qty 84, 84d supply, fill #0

## 2023-07-13 ENCOUNTER — Other Ambulatory Visit: Payer: Self-pay | Admitting: Family Medicine

## 2023-07-13 ENCOUNTER — Other Ambulatory Visit: Payer: Self-pay

## 2023-07-14 ENCOUNTER — Other Ambulatory Visit: Payer: Self-pay

## 2023-07-15 ENCOUNTER — Other Ambulatory Visit: Payer: Self-pay

## 2023-07-16 ENCOUNTER — Other Ambulatory Visit: Payer: Self-pay

## 2023-07-17 ENCOUNTER — Other Ambulatory Visit: Payer: Self-pay

## 2023-07-17 MED FILL — Levocetirizine Dihydrochloride Tab 5 MG: ORAL | 90 days supply | Qty: 90 | Fill #0 | Status: AC

## 2023-07-17 NOTE — Telephone Encounter (Signed)
 LOV 05/11/2023  Requested Prescriptions  Pending Prescriptions Disp Refills   levocetirizine (XYZAL ) 5 MG tablet 30 tablet 5    Sig: Take 1 tablet (5 mg total) by mouth every evening.     Ear, Nose, and Throat:  Antihistamines - levocetirizine dihydrochloride  Failed - 07/17/2023 10:39 AM      Failed - Valid encounter within last 12 months    Recent Outpatient Visits           2 months ago Sore throat   [facility] [place], Marsa PARAS, DO              Passed - Cr in normal range and within 360 days    Creat  Date Value Ref Range Status  11/07/2022 0.81 0.50 - 0.96 mg/dL Final         Passed - eGFR is 10 or above and within 360 days    eGFR  Date Value Ref Range Status  11/07/2022 106 > OR = 60 mL/min/1.41m2 Final

## 2023-07-19 ENCOUNTER — Other Ambulatory Visit: Payer: Self-pay

## 2023-08-20 ENCOUNTER — Ambulatory Visit: Admitting: Family Medicine

## 2023-08-20 ENCOUNTER — Other Ambulatory Visit: Payer: Self-pay

## 2023-08-20 ENCOUNTER — Encounter: Payer: Self-pay | Admitting: Family Medicine

## 2023-08-20 VITALS — BP 120/80 | HR 93 | Ht 70.0 in | Wt 273.8 lb

## 2023-08-20 DIAGNOSIS — M7918 Myalgia, other site: Secondary | ICD-10-CM

## 2023-08-20 DIAGNOSIS — G8929 Other chronic pain: Secondary | ICD-10-CM | POA: Diagnosis not present

## 2023-08-20 MED ORDER — DULOXETINE HCL 30 MG PO CPEP
30.0000 mg | ORAL_CAPSULE | Freq: Every day | ORAL | 0 refills | Status: DC
Start: 2023-08-20 — End: 2023-09-27
  Filled 2023-08-20: qty 7, 7d supply, fill #0

## 2023-08-20 MED ORDER — DULOXETINE HCL 60 MG PO CPEP
60.0000 mg | ORAL_CAPSULE | Freq: Every day | ORAL | 0 refills | Status: DC
  Filled 2023-08-20: qty 90, 90d supply, fill #0

## 2023-08-20 NOTE — Progress Notes (Signed)
 Primary Care / Sports Medicine Office Visit  Patient Information:  Patient ID: Valerie Martinez, female DOB: 24-Jun-2000 Age: 23 y.o. MRN: [id number]   Valerie Martinez is a pleasant 23 y.o. female presenting with the following:  Chief Complaint  Patient presents with   Joint Pain    Patient having joint pain on right side of body. Patient having aches in her right shoulder, wrist, and fingers. She took tylenol last night, used heat and ice alternating the two until she was able to go to sleep. NKI. No xray's.    Vitals:   08/20/23 1339  BP: 120/80  Pulse: 93  SpO2: 98%   Vitals:   08/20/23 1339  Weight: 273 lb 12.8 oz (124.2 kg)  Height: 5' 10 (1.778 m)   Body mass index is 39.29 kg/m.  No results found.   Independent interpretation of notes and tests performed by another provider:   None  Procedures performed:   None  Pertinent History, Exam, Impression, and Recommendations:   Problem List Items Addressed This Visit     Chronic musculoskeletal pain - Primary   History of Present Illness Valerie Martinez is a 23 year old female who presents with polyarthralgia.  Polyarticular joint pain and stiffness - Persistent pain in the back and other areas - Sensitivity in the feet and knuckles - Difficulty wearing clothes due to pain - MRIs demonstrate lumbar subtle degenerative changes - Physical therapy previously attempted without adequate relief - Scheduled to start a new physical therapy program at [facility] on August 4th - Uses Tylenol, ice, and heat for pain management with ongoing symptoms  Evaluation for systemic autoimmune disease - Rheumatology evaluation identified eight out of ten symptoms for lupus but did not meet full diagnostic criteria - Laboratory studies including ANA, complement C3, CRP, and sedimentation rate were within normal limits - Rheumatology raised concern for fibromyalgia (EMR notes reviewed)  Results LABS ANA:  negative Complement C3: 31 CRP: normal Sedimentation rate: normal  RADIOLOGY IMPRESSION: 1. No significant disc degeneration, disc herniation, spinal canal stenosis or neural foraminal narrowing within the lumbar spine. 2. Facet hypertrophy at L2-L3 and L3-L4. 3. 5 mm posterolaterally projecting synovial facet cyst on the left at L3-L4.  Assessment and Plan Chronic musculoskeletal pain  Chronic pain syndrome with joint pain and sensitivity to touch. Differential diagnosis included CRPS and myofascial pain syndrome. Previous rheumatology evaluation did not confirm lupus. Labs including ANA, CRP, and sedimentation rate were normal.Discussed Cymbalta  MOA with neurotransmitter modulation in pain perception and potential benefits of Cymbalta , including its non-addictive nature and AE profile. Explained potential side effects such as stomach upset and the importance of consistent dosing. Noted potential benefit for PMDD symptoms citing Mazza et?al. (2008) and Ramos et al., (2009). - Prescribed Cymbalta  (duloxetine ) starting at 30 mg daily for one week, then increase to 60 mg daily.  - Schedule follow-up in two months to assess response to treatment. - Encourage continuation of physical therapy with new provider at [facility] starting August 4th. - Discuss potential for biofeedback techniques with physical therapist.      Relevant Medications   DULoxetine  (CYMBALTA ) 30 MG capsule   DULoxetine  (CYMBALTA ) 60 MG capsule   A total of 40 minutes was spent on the date of service, 08/20/2023, encompassing both face-to-face and non-face-to-face time. This included review of prior records and imaging (e.g., MRI and/or radiographs), medical chart review, information gathering, documentation, care coordination with clinic staff, discussion and counseling with the  patient regarding clinical findings and treatment options, and planning for follow-up and next steps in management.   Orders &  Medications Medications:  Meds ordered this encounter  Medications   DULoxetine  (CYMBALTA ) 30 MG capsule    Sig: Take 1 capsule (30 mg total) by mouth daily for 7 days. Then switch to higher dose.    Dispense:  7 capsule    Refill:  0   DULoxetine  (CYMBALTA ) 60 MG capsule    Sig: Take 1 capsule (60 mg total) by mouth daily.    Dispense:  90 capsule    Refill:  0   No orders of the defined types were placed in this encounter.    Return in about 2 months (around 10/21/2023).     Selinda JINNY Ku, MD, [facility]   Primary Care Sports Medicine Primary Care and Sports Medicine at [facility]

## 2023-08-20 NOTE — Patient Instructions (Signed)
 Patient Plan for Post-Visit Guidance  1. Chronic Musculoskeletal Pain:    - Start taking Cymbalta  (duloxetine ) at 30 mg daily for one week, then increase to 60 mg daily.    - Schedule a follow-up appointment in two months to assess your response to the treatment.    - Continue with the new physical therapy program at [facility] starting August 4th.    - Discuss the potential for biofeedback techniques with your physical therapist.  Red Flags: - If you experience any new or worsening symptoms, contact the office immediately.

## 2023-08-20 NOTE — Assessment & Plan Note (Signed)
 History of Present Illness Valerie Martinez is a 23 year old female who presents with polyarthralgia.  Polyarticular joint pain and stiffness - Persistent pain in the back and other areas - Sensitivity in the feet and knuckles - Difficulty wearing clothes due to pain - MRIs demonstrate lumbar subtle degenerative changes - Physical therapy previously attempted without adequate relief - Scheduled to start a new physical therapy program at [facility] on August 4th - Uses Tylenol, ice, and heat for pain management with ongoing symptoms  Evaluation for systemic autoimmune disease - Rheumatology evaluation identified eight out of ten symptoms for lupus but did not meet full diagnostic criteria - Laboratory studies including ANA, complement C3, CRP, and sedimentation rate were within normal limits - Rheumatology raised concern for fibromyalgia (EMR notes reviewed)  Results LABS ANA: negative Complement C3: 31 CRP: normal Sedimentation rate: normal  RADIOLOGY IMPRESSION: 1. No significant disc degeneration, disc herniation, spinal canal stenosis or neural foraminal narrowing within the lumbar spine. 2. Facet hypertrophy at L2-L3 and L3-L4. 3. 5 mm posterolaterally projecting synovial facet cyst on the left at L3-L4.  Assessment and Plan Chronic musculoskeletal pain  Chronic pain syndrome with joint pain and sensitivity to touch. Differential diagnosis included CRPS and myofascial pain syndrome. Previous rheumatology evaluation did not confirm lupus. Labs including ANA, CRP, and sedimentation rate were normal.Discussed Cymbalta  MOA with neurotransmitter modulation in pain perception and potential benefits of Cymbalta , including its non-addictive nature and AE profile. Explained potential side effects such as stomach upset and the importance of consistent dosing. Noted potential benefit for PMDD symptoms citing Mazza et?al. (2008) and Ramos et al., (2009). - Prescribed Cymbalta   (duloxetine ) starting at 30 mg daily for one week, then increase to 60 mg daily.  - Schedule follow-up in two months to assess response to treatment. - Encourage continuation of physical therapy with new provider at [facility] starting August 4th. - Discuss potential for biofeedback techniques with physical therapist.

## 2023-08-24 ENCOUNTER — Encounter: Payer: Self-pay | Admitting: Family Medicine

## 2023-08-25 ENCOUNTER — Other Ambulatory Visit: Payer: Self-pay | Admitting: Family Medicine

## 2023-08-25 DIAGNOSIS — K219 Gastro-esophageal reflux disease without esophagitis: Secondary | ICD-10-CM

## 2023-08-27 ENCOUNTER — Other Ambulatory Visit (HOSPITAL_COMMUNITY): Payer: Self-pay

## 2023-08-27 ENCOUNTER — Telehealth: Payer: Self-pay

## 2023-08-27 ENCOUNTER — Encounter: Payer: Self-pay | Admitting: Family Medicine

## 2023-08-27 ENCOUNTER — Other Ambulatory Visit: Payer: Self-pay | Admitting: Family Medicine

## 2023-08-27 ENCOUNTER — Other Ambulatory Visit: Payer: Self-pay

## 2023-08-27 ENCOUNTER — Ambulatory Visit (INDEPENDENT_AMBULATORY_CARE_PROVIDER_SITE_OTHER): Admitting: Family Medicine

## 2023-08-27 VITALS — BP 120/84 | HR 91 | Ht 70.0 in | Wt 267.4 lb

## 2023-08-27 DIAGNOSIS — G8929 Other chronic pain: Secondary | ICD-10-CM | POA: Diagnosis not present

## 2023-08-27 DIAGNOSIS — K219 Gastro-esophageal reflux disease without esophagitis: Secondary | ICD-10-CM

## 2023-08-27 DIAGNOSIS — M5116 Intervertebral disc disorders with radiculopathy, lumbar region: Secondary | ICD-10-CM | POA: Diagnosis not present

## 2023-08-27 DIAGNOSIS — M7918 Myalgia, other site: Secondary | ICD-10-CM | POA: Diagnosis not present

## 2023-08-27 MED ORDER — ONDANSETRON 4 MG PO TBDP
4.0000 mg | ORAL_TABLET | Freq: Three times a day (TID) | ORAL | 0 refills | Status: AC | PRN
  Filled 2023-08-27: qty 20, 7d supply, fill #0

## 2023-08-27 NOTE — Telephone Encounter (Signed)
 Please review and advise.   JM

## 2023-08-27 NOTE — Telephone Encounter (Signed)
 Called patient in regards to appt. Patient did not answer. Voicemail was left for patient to call back. Dr. Alvia was wanting to know if patient had questions regarding medication.  JM

## 2023-08-28 ENCOUNTER — Other Ambulatory Visit: Payer: Self-pay

## 2023-08-28 ENCOUNTER — Encounter: Payer: Self-pay | Admitting: Family Medicine

## 2023-08-28 MED FILL — Pantoprazole Sodium EC Tab 40 MG (Base Equiv): ORAL | 90 days supply | Qty: 180 | Fill #0 | Status: AC

## 2023-08-28 NOTE — Telephone Encounter (Signed)
 Requested Prescriptions  Pending Prescriptions Disp Refills   pantoprazole  (PROTONIX ) 40 MG tablet 180 tablet 1    Sig: Take 1 tablet (40 mg total) by mouth 2 (two) times daily before a meal.     Gastroenterology: Proton Pump Inhibitors Passed - 08/28/2023  2:22 PM      Passed - Valid encounter within last 12 months    Recent Outpatient Visits           Yesterday Chronic musculoskeletal pain   [place] [facility], Selinda PARAS, MD   1 week ago Chronic musculoskeletal pain   [place] [facility] at [facility] Selinda PARAS, MD   3 months ago Sore throat    [facility] Marsa PARAS, DO       Future Appointments             In 1 week Lemon Raisin, MD [facility] Primary Care & Sports Medicine at [facility]   In 1 month Ku, Selinda PARAS, MD [facility] Primary Care & Sports Medicine at [facility]
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
## 2023-08-28 NOTE — Patient Instructions (Signed)
 Patient Plan for Post-Visit Guidance  Chronic Widespread Musculoskeletal Pain (Suspected Fibromyalgia)  - Continue physical therapy twice a week. - Consider referral to a psychiatrist if symptoms persist or worsen.  Facet Joint Arthritis (Lumbar Spine) and Degenerative Disc Disease (Cervical Spine)  - Continue physical therapy with focus on back and neck. - Monitor symptoms and adjust treatment as needed.  Medication Side Effects (Duloxetine )  - Contact the office to discuss next steps for duloxetine :   - Option to reduce dose to 20 mg and take with food at dinner.   - Monitor for side effects and effectiveness over the next week.   - Consider stopping duloxetine  if side effects continue or worsen.  Nausea with Decreased Appetite  - Take ondansetron  as needed for nausea. - Take duloxetine  with food to help reduce nausea. - Monitor nutritional intake and weight.  Constipation Secondary to Ondansetron   - Increase dietary fiber and water intake. - Use over-the-counter laxatives if constipation does not improve with dietary changes.  Red Flags: - If you experience severe abdominal pain, vomiting blood, black or tarry stools, sudden mood changes, severe anxiety, chest pain, or any new or worsening symptoms, contact the office immediately.

## 2023-08-29 ENCOUNTER — Other Ambulatory Visit: Payer: Self-pay

## 2023-08-29 DIAGNOSIS — M5116 Intervertebral disc disorders with radiculopathy, lumbar region: Secondary | ICD-10-CM | POA: Diagnosis not present

## 2023-08-30 ENCOUNTER — Other Ambulatory Visit: Payer: Self-pay

## 2023-08-30 DIAGNOSIS — F3281 Premenstrual dysphoric disorder: Secondary | ICD-10-CM | POA: Diagnosis not present

## 2023-08-30 DIAGNOSIS — F330 Major depressive disorder, recurrent, mild: Secondary | ICD-10-CM | POA: Diagnosis not present

## 2023-08-30 MED ORDER — DROSPIRENONE-ETHINYL ESTRADIOL 3-0.02 MG PO TABS
1.0000 | ORAL_TABLET | Freq: Every day | ORAL | 5 refills | Status: DC
  Filled 2023-08-30 – 2023-09-26 (×2): qty 84, 84d supply, fill #0

## 2023-09-03 DIAGNOSIS — M5116 Intervertebral disc disorders with radiculopathy, lumbar region: Secondary | ICD-10-CM | POA: Diagnosis not present

## 2023-09-05 DIAGNOSIS — M5116 Intervertebral disc disorders with radiculopathy, lumbar region: Secondary | ICD-10-CM | POA: Diagnosis not present

## 2023-09-08 MED FILL — Levocetirizine Dihydrochloride Tab 5 MG: ORAL | 90 days supply | Qty: 90 | Fill #1 | Status: CN

## 2023-09-09 ENCOUNTER — Other Ambulatory Visit: Payer: Self-pay

## 2023-09-10 ENCOUNTER — Ambulatory Visit: Admitting: Student

## 2023-09-10 ENCOUNTER — Encounter: Payer: Self-pay | Admitting: Student

## 2023-09-10 VITALS — BP 126/82 | HR 93 | Ht 70.0 in | Wt 271.0 lb

## 2023-09-10 DIAGNOSIS — G8929 Other chronic pain: Secondary | ICD-10-CM | POA: Diagnosis not present

## 2023-09-10 DIAGNOSIS — R7989 Other specified abnormal findings of blood chemistry: Secondary | ICD-10-CM | POA: Diagnosis not present

## 2023-09-10 DIAGNOSIS — Z9109 Other allergy status, other than to drugs and biological substances: Secondary | ICD-10-CM

## 2023-09-10 DIAGNOSIS — Z6838 Body mass index (BMI) 38.0-38.9, adult: Secondary | ICD-10-CM

## 2023-09-10 DIAGNOSIS — M7918 Myalgia, other site: Secondary | ICD-10-CM | POA: Diagnosis not present

## 2023-09-10 DIAGNOSIS — E538 Deficiency of other specified B group vitamins: Secondary | ICD-10-CM | POA: Insufficient documentation

## 2023-09-10 DIAGNOSIS — K219 Gastro-esophageal reflux disease without esophagitis: Secondary | ICD-10-CM | POA: Diagnosis not present

## 2023-09-10 DIAGNOSIS — F32A Depression, unspecified: Secondary | ICD-10-CM

## 2023-09-10 DIAGNOSIS — F419 Anxiety disorder, unspecified: Secondary | ICD-10-CM

## 2023-09-10 DIAGNOSIS — M5116 Intervertebral disc disorders with radiculopathy, lumbar region: Secondary | ICD-10-CM | POA: Diagnosis not present

## 2023-09-10 DIAGNOSIS — E66812 Obesity, class 2: Secondary | ICD-10-CM | POA: Diagnosis not present

## 2023-09-10 DIAGNOSIS — R110 Nausea: Secondary | ICD-10-CM | POA: Insufficient documentation

## 2023-09-10 DIAGNOSIS — J45990 Exercise induced bronchospasm: Secondary | ICD-10-CM

## 2023-09-10 NOTE — Assessment & Plan Note (Signed)
 She takes pantoprazole  40 mg twice daily for the past 4 months, symptoms well controlled continue current medication.

## 2023-09-10 NOTE — Assessment & Plan Note (Addendum)
 Lower left sided back pain sometime upper back pain should and legs pain, current seeing Dr. Alvia regarding this and treated with PT and cymbalta  and tizanidine . Unable to tolerate cymbalta . And dose was dropped to 20 mg daily however still fel very anxious and restless on this and discontinued this. Tizanidine  helps temporarily with pain discussed taking this after PT sessions. Offered trial of gabapentin however she would like to hold off for now.

## 2023-09-10 NOTE — Assessment & Plan Note (Signed)
 Tres, gras, cats, dog, and certain foods, peanut and tree nuts, aplles peachses, red meat products no alpha gal,
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
## 2023-09-10 NOTE — Assessment & Plan Note (Signed)
 Has not had to use Zofran  in the last few weeks, some lactone cheese, yogurt, or ranch,

## 2023-09-10 NOTE — Assessment & Plan Note (Signed)
 Mostly exercise sporadic as she as she is liess physically in shape  Symbicort  and albuterol , allergy  and LBPM

## 2023-09-11 NOTE — Assessment & Plan Note (Signed)
 Discussed healthy diet and exercises recommendations. Will make referral to RD given multiple food allergies and intolerances.

## 2023-09-11 NOTE — Assessment & Plan Note (Signed)
 B12 on 3/12 was 170. Did complete 4 weeks of vitamin b12 injections, not currently taking b12 supplements. Advised she resume oral supplementation. Will repeat b12 level at next visit.

## 2023-09-12 DIAGNOSIS — M5116 Intervertebral disc disorders with radiculopathy, lumbar region: Secondary | ICD-10-CM | POA: Diagnosis not present

## 2023-09-14 DIAGNOSIS — N83201 Unspecified ovarian cyst, right side: Secondary | ICD-10-CM | POA: Diagnosis not present

## 2023-09-14 DIAGNOSIS — D252 Subserosal leiomyoma of uterus: Secondary | ICD-10-CM | POA: Diagnosis not present

## 2023-09-18 ENCOUNTER — Other Ambulatory Visit: Payer: Self-pay | Admitting: Obstetrics and Gynecology

## 2023-09-18 ENCOUNTER — Encounter: Payer: Self-pay | Admitting: Obstetrics and Gynecology

## 2023-09-18 DIAGNOSIS — N6489 Other specified disorders of breast: Secondary | ICD-10-CM

## 2023-09-20 ENCOUNTER — Ambulatory Visit
Admission: RE | Admit: 2023-09-20 | Discharge: 2023-09-20 | Disposition: A | Discharge status: Home / Self Care | Source: Ambulatory Visit | Attending: Obstetrics and Gynecology | Admitting: Obstetrics and Gynecology

## 2023-09-20 DIAGNOSIS — N6489 Other specified disorders of breast: Secondary | ICD-10-CM | POA: Insufficient documentation

## 2023-09-20 DIAGNOSIS — N644 Mastodynia: Secondary | ICD-10-CM | POA: Diagnosis not present

## 2023-09-26 ENCOUNTER — Other Ambulatory Visit: Payer: Self-pay

## 2023-09-27 ENCOUNTER — Ambulatory Visit (INDEPENDENT_AMBULATORY_CARE_PROVIDER_SITE_OTHER): Admitting: Psychiatry

## 2023-09-27 ENCOUNTER — Encounter: Payer: Self-pay | Admitting: Psychiatry

## 2023-09-27 ENCOUNTER — Other Ambulatory Visit: Payer: Self-pay

## 2023-09-27 ENCOUNTER — Telehealth: Payer: Self-pay

## 2023-09-27 VITALS — BP 127/84 | HR 83 | Temp 96.4°F | Ht 70.0 in | Wt 276.2 lb

## 2023-09-27 DIAGNOSIS — Z79899 Other long term (current) drug therapy: Secondary | ICD-10-CM | POA: Diagnosis not present

## 2023-09-27 DIAGNOSIS — F439 Reaction to severe stress, unspecified: Secondary | ICD-10-CM | POA: Diagnosis not present

## 2023-09-27 DIAGNOSIS — F3281 Premenstrual dysphoric disorder: Secondary | ICD-10-CM | POA: Insufficient documentation
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
## 2023-09-27 NOTE — Patient Instructions (Addendum)
 [email or web address]  [email or web address]  [employer] [email or web address] [address], [place], [place] [address]  [phone]  [facility], [email or web address] [address], [place], [place] [address]  [phone]   [facility] - [phone]  [facility] - [phone]  [facility] - [phone] counseling [phone]  [facility] - [phone] [facility] [address] [place], [place] [address]  [phone]    Belvie Chancy [address] [place], [place] [address]  [phone]    [facility] [address] [place], [place] [address]  [phone]     Premenstrual Syndrome (PMS): What to Know Premenstrual syndrome (PMS) is a group of symptoms that can affect people as part of their menstrual cycle. PMS usually happens several days before the start of a menstrual period and goes away a few days after bleeding begins. PMS symptoms can range from mild to very bad. What are the causes? The exact cause isn't known, but it's likely related to hormone changes that happen before your period. Hormones are chemicals that affect how your body works. What are the signs or symptoms? Physical symptoms include: Headaches. Bloating and weight gain. Swollen or painful breasts. Being very tired. Backache. Swelling of your hands and feet. Diarrhea, which is watery poop, or trouble pooping (constipation). Emotional symptoms include: Mood swings. Anxiety Depression. Angry or mean outbursts. Being easily annoyed (irritable). Crying. Behavioral symptoms include: Food cravings or appetite changes. Changes in sexual desire. Confusion. Social withdrawal. Not being able to focus well. Trouble sleeping. Symptoms often happen every month. Different people have  different symptoms. How is this diagnosed? PMS may be diagnosed based on a history of your symptoms. PMS is generally diagnosed if symptoms: Happen 5 days before your period starts. End within 4 days after your period starts. Happen at least 3 months in a row. Get in the way of some of your usual activities. Other problems that can cause some of these symptoms must be ruled out before PMS can be diagnosed. These include depression, anxiety, anemia, and thyroid problems. How is this treated? PMS may be treated with: Having a healthy lifestyle. This includes eating healthy foods and exercising regularly. Medicines you can buy at the store. These may help relieve symptoms, such as: Cramps. Aches. Pain. Headaches. Breast tenderness. Other medicines if needed. If your symptoms are very bad, your health care provider may prescribe medicines to help. Follow these instructions at home: Eating and drinking  Eat a well-balanced diet. Avoid caffeine and alcohol. Limit the amount of salty and sugary foods you eat. This may help reduce bloating. Drink more fluids as told. Take a multivitamin if told. Lifestyle  Do not smoke, vape, or use nicotine or tobacco. Exercise as told. Get enough sleep. For most adults, this is 7-8 hours of sleep each night. Practice relaxation techniques, such as: Yoga. Breathing exercises. Meditation. Find healthy ways to manage stress. General instructions  To help your provider choose the best treatment for you, for 2-3 months, write down: Your symptoms. Whether your symptoms are mild or very bad. How long your symptoms last. Take your medicines only as told. If you're using birth control pills, use them as told. Contact a health care provider if: Your symptoms get worse. You develop new symptoms. You have trouble doing your daily activities. This information is not  intended to replace advice given to you by your health care provider. Make sure you  discuss any questions you have with your health care provider. Document Revised: 10/29/2022 Document Reviewed: 10/29/2022 Elsevier Patient Education  2025 Elsevier Inc.Paroxetine Tablets What is this medication? PAROXETINE (pa ROX e teen) treats depression, anxiety, obsessive-compulsive disorder (OCD), post-traumatic stress disorder (PTSD), and premenstrual dysphoric disorder (PMDD). It increases the amount of serotonin in the brain, a hormone that helps regulate mood. It belongs to a group of medications called SSRIs. This medicine may be used for other purposes; ask your health care provider or pharmacist if you have questions. COMMON BRAND NAME(S): Paxil, Pexeva What should I tell my care team before I take this medication? They need to know if you have any of these conditions: Bipolar disorder or a family history of bipolar disorder Bleeding disorder Glaucoma Heart disease Kidney disease Liver disease Low levels of sodium in the blood Seizures Suicidal thoughts, plans, or attempt by you or a family member Take MAOIs, such as Carbex, Eldepryl, Marplan, Nardil, and Parnate Take medications that treat or prevent blood clots Thyroid disease An unusual or allergic reaction to paroxetine, other medications, foods, dyes, or preservatives Pregnant or trying to get pregnant Breastfeeding How should I use this medication? Take this medication by mouth with water. Take it as directed on the prescription label at the same time every day. You can take it with or without food. If it upsets your stomach, take it with food. Do not take your medication more often than directed. Keep taking this medication unless your care team tells you to stop. Stopping it too quickly can cause serious side effects. It can also make your condition worse. A special MedGuide will be given to you by the pharmacist with each prescription and refill. Be sure to read this information carefully each time. Talk to your care  team about the use of this medication in children. Special care may be needed. Overdosage: If you think you have taken too much of this medicine contact a poison control center or emergency room at once. NOTE: This medicine is only for you. Do not share this medicine with others. What if I miss a dose? If you miss a dose, take it as soon as you can. If it is almost time for your next dose, take only that dose. Do not take double or extra doses. What may interact with this medication? Do not take this medication with any of the following: Linezolid MAOIs, such as Carbex, Eldepryl, Marplan, Nardil, and Parnate Methylene blue (injected into a vein) Pimozide Thioridazine This medication may also interact with the following: Alcohol Amphetamines Aspirin and aspirin-like medications Atomoxetine Certain medications for irregular heart beat, such as propafenone, flecainide, encainide, and quinidine Certain medications for mental health conditions Certain medications for migraine headache, such as almotriptan, eletriptan, frovatriptan, naratriptan, rizatriptan , sumatriptan, zolmitriptan Cimetidine Digoxin Diuretics Fentanyl Fosamprenavir Furazolidone Isoniazid  Lithium Medications that treat or prevent blood clots, such as warfarin, enoxaparin, and dalteparin Medications for sleep NSAIDs, medications for pain and inflammation, such as ibuprofen  or naproxen Phenobarbital Phenytoin Procarbazine Rasagiline Ritonavir Supplements, such as St. John's wort, kava kava, valerian Tamoxifen Tramadol Tryptophan This list may not describe all possible interactions. Give your health care provider a list of all the medicines, herbs, non-prescription drugs, or dietary supplements you use. Also tell them if you smoke, drink alcohol, or use illegal drugs. Some items may interact with your medicine. What should I watch for while using this medication?  Tell your care team if your symptoms do not get  better or if they get worse. Visit your care team for regular checks on your progress. Because it may take several weeks to see the full effects of this medication, it is important to continue your treatment as prescribed by your care team. Watch for new or worsening thoughts of suicide or depression. This includes sudden changes in mood, behaviors, or thoughts. These changes can happen at any time but are more common in the beginning of treatment or after a change in dose. Call your care team right away if you experience these thoughts or worsening depression. This medication may cause mood and behavior changes, such as anxiety, nervousness, irritability, hostility, restlessness, excitability, hyperactivity, or trouble sleeping. These changes can happen at any time but are more common in the beginning of treatment or after a change in dose. Call your care team right away if you notice any of these symptoms. This medication may affect your coordination, reaction time, or judgment. Do not drive or operate machinery until you know how this medication affects you. Sit up or stand slowly to reduce the risk of dizzy or fainting spells. Drinking alcohol with this medication can increase the risk of these side effects. Your mouth may get dry. Chewing sugarless gum or sucking hard candy and drinking plenty of water may help. Contact your care team if the problem does not go away or is severe. What side effects may I notice from receiving this medication? Side effects that you should report to your care team as soon as possible: Allergic reactions--skin rash, itching, hives, swelling of the face, lips, tongue, or throat Bleeding--bloody or black, tar-like stools, red or dark brown urine, vomiting blood or brown material that looks like coffee grounds, small, red or purple spots on skin, unusual bleeding or bruising Heart rhythm changes--fast or irregular heartbeat, dizziness, feeling faint or lightheaded, chest  pain, trouble breathing Low sodium level--muscle weakness, fatigue, dizziness, headache, confusion Serotonin syndrome--irritability, confusion, fast or irregular heartbeat, muscle stiffness, twitching muscles, sweating, high fever, seizures, chills, vomiting, diarrhea Sudden eye pain or change in vision such as blurry vision, seeing halos around lights, vision loss Thoughts of suicide or self-harm, worsening mood, feelings of depression Side effects that usually do not require medical attention (report to your care team if they continue or are bothersome): Change in sex drive or performance Diarrhea Excessive sweating Nausea Tremors or shaking Upset stomach This list may not describe all possible side effects. Call your doctor for medical advice about side effects. You may report side effects to FDA at 1-800-FDA-1088. Where should I keep my medication? Keep out of the reach of children and pets. Store at room temperature between 15 and 30 degrees C (59 and 86 degrees F). Keep container tightly closed. Throw away any unused medication after the expiration date. NOTE: This sheet is a summary. It may not cover all possible information. If you have questions about this medicine, talk to your doctor, pharmacist, or health care provider.  2024 Elsevier/Gold Standard (2021-11-14 00:00:00)Fluoxetine Capsules or Tablets (PMDD) What is this medication? FLUOXETINE (floo OX e teen) treats premenstrual dysphoric disorder (PMDD). It increases the amount of serotonin in the brain, a hormone that helps regulate mood. It belongs to a group of medications called SSRIs. This medicine may be used for other purposes; ask your health care provider or pharmacist if you have questions. COMMON BRAND NAME(S): Prozac, Sarafem, Selfemra What should I tell my care team before  I take this medication? They need to know if you have any of these conditions: Bipolar disorder or a family history of bipolar disorder Bleeding  disorder Glaucoma Heart disease Liver disease Low levels of sodium in the blood Seizures Suicidal thoughts, plans, or attempt by you or a family member Take an MAOI, such as Carbex, Eldepryl, Marplan, Nardil, or Parnate Take medications that treat or prevent blood clots Thyroid disease An unusual or allergic reaction to fluoxetine, other medications, foods, dyes, or preservatives Pregnant or trying to get pregnant Breastfeeding How should I use this medication? Take this medication by mouth with a glass of water. Follow the directions on the prescription label. You can take it with or without food. Take your medication at regular intervals. Do not take it more often than directed. Do not stop taking this medication suddenly except upon the advice of your care team. Stopping this medication too quickly may cause serious side effects or your condition may worsen. A special MedGuide will be given to you by the pharmacist with each prescription and refill. Be sure to read this information carefully each time. Talk to your care team about the use of this medication in children. Special care may be needed. Overdosage: If you think you have taken too much of this medicine contact a poison control center or emergency room at once. NOTE: This medicine is only for you. Do not share this medicine with others. What if I miss a dose? If you miss a dose, skip the missed dose and go back to your regular dosing schedule. Do not take double or extra doses. What may interact with this medication? Do not take this medication with any of the following: Other medications containing fluoxetine, such as Prozac or Symbyax Cisapride Dronedarone Linezolid MAOIs, such as Carbex, Eldepryl, Marplan, Nardil, and Parnate Methylene blue (injected into a vein) Pimozide Thioridazine This medication may also interact with the following: Alcohol Amphetamines Aspirin and aspirin-like medications Carbamazepine Certain  medications for mental health conditions Certain medications for migraine headache, such as almotriptan, eletriptan, frovatriptan, naratriptan, rizatriptan , sumatriptan, zolmitriptan Digoxin Diuretics Fentanyl Flecainide Furazolidone Isoniazid  Lithium Medications that help you fall asleep Medications that treat or prevent blood clots, such as warfarin, enoxaparin, and dalteparin NSAIDs, medications for pain and inflammation, such as ibuprofen  or naproxen Other medications that cause heart rhythm changes Phenytoin Procarbazine Propafenone Rasagiline Ritonavir Supplements, such as St. John's wort, kava kava, valerian Tramadol Tryptophan Vinblastine This list may not describe all possible interactions. Give your health care provider a list of all the medicines, herbs, non-prescription drugs, or dietary supplements you use. Also tell them if you smoke, drink alcohol, or use illegal drugs. Some items may interact with your medicine. What should I watch for while using this medication? Tell your care team if your symptoms do not get better or if they get worse. Visit your care team for regular checks on your progress. Because it may take several weeks to see the full effects of this medication, it is important to continue your treatment as prescribed. Watch for new or worsening thoughts of suicide or depression. This includes sudden changes in mood, behavior, or thoughts. These changes can happen at any time but are more common in the beginning of treatment or after a change in dose. Call your care team right away if you experience these thoughts or worsening depression. This medication may cause mood and behavior changes, such as anxiety, nervousness, irritability, hostility, restlessness, excitability, hyperactivity, or trouble sleeping. These changes can  happen at any time but are more common in the beginning of treatment or after a change in dose. Call your care team right away if you notice  any of these symptoms. This medication may affect your coordination, reaction time, or judgment. Do not drive or operate machinery until you know how this medication affects you. Sit up or stand slowly to reduce the risk of dizzy or fainting spells. Drinking alcohol with this medication can increase the risk of these side effects. Your mouth may get dry. Chewing sugarless gum or sucking hard candy and drinking plenty of water may help. Contact your care team if the problem does not go away or is severe. This medication may affect blood sugar levels. If you have diabetes, check with your care team before you make changes to your diet or medications. What side effects may I notice from receiving this medication? Side effects that you should report to your care team as soon as possible: Allergic reactions--skin rash, itching, hives, swelling of the face, lips, tongue, or throat Bleeding--bloody or black, tar-like stools, red or dark brown urine, vomiting blood or brown material that looks like coffee grounds, small, red or purple spots on skin, unusual bleeding or bruising Heart rhythm changes--fast or irregular heartbeat, dizziness, feeling faint or lightheaded, chest pain, trouble breathing Loss of appetite with weight loss Low sodium level--muscle weakness, fatigue, dizziness, headache, confusion Serotonin syndrome--irritability, confusion, fast or irregular heartbeat, muscle stiffness, twitching muscles, sweating, high fever, seizure, chills, vomiting, diarrhea Sudden eye pain or change in vision such as blurry vision, seeing halos around lights, vision loss Thoughts of suicide or self-harm, worsening mood, feelings of depression Side effects that usually do not require medical attention (report to your care team if they continue or are bothersome): Anxiety, nervousness Change in sex drive or performance Diarrhea Dry mouth Headache Excessive sweating Nausea Tremors or shaking Trouble  sleeping Upset stomach This list may not describe all possible side effects. Call your doctor for medical advice about side effects. You may report side effects to FDA at 1-800-FDA-1088. Where should I keep my medication? Keep out of the reach of children and pets. Store at room temperature between 15 and 30 degrees C (59 and 86 degrees F). Get rid of any unused medication after the expiration date. NOTE: This sheet is a summary. It may not cover all possible information. If you have questions about this medicine, talk to your doctor, pharmacist, or health care provider.  2024 Elsevier/Gold Standard (2021-10-25 00:00:00)Escitalopram Tablets What is this medication? ESCITALOPRAM (es sye TAL oh pram) treats depression and anxiety. It increases the amount of serotonin in the brain, a hormone that helps regulate mood. It belongs to a group of medications called SSRIs. This medicine may be used for other purposes; ask your health care provider or pharmacist if you have questions. COMMON BRAND NAME(S): Lexapro What should I tell my care team before I take this medication? They need to know if you have any of these conditions: Bipolar disorder or a family history of bipolar disorder Diabetes Glaucoma Heart disease Kidney disease Liver disease Receiving electroconvulsive therapy Seizures Suicidal thoughts, plans, or attempt by you or a family member An unusual or allergic reaction to escitalopram, other medications, foods, dyes, or preservatives Pregnant or trying to become pregnant Breastfeeding How should I use this medication? Take this medication by mouth with a glass of water. Take it as directed on the prescription label at the same time every day. You can take it  with or without food. If it upsets your stomach, take it with food. Do not take it more often than directed. Do not stop taking this medication suddenly except upon the advice of your care team. Stopping this medication too  quickly may cause serious side effects or your condition may worsen. A special MedGuide will be given to you by the pharmacist with each prescription and refill. Be sure to read this information carefully each time. Talk to your care team about the use of this medication in children. Special care may be needed. Overdosage: If you think you have taken too much of this medicine contact a poison control center or emergency room at once. NOTE: This medicine is only for you. Do not share this medicine with others. What if I miss a dose? If you miss a dose, take it as soon as you can. If it is almost time for your next dose, take only that dose. Do not take double or extra doses. What may interact with this medication? Do not take this medication with any of the following: Certain medications for fungal infections, such as fluconazole , itraconazole, ketoconazole, posaconazole, voriconazole Cisapride Citalopram Dronedarone Linezolid MAOIs, such as Carbex, Eldepryl, Marplan, Nardil, and Parnate Methylene blue (injected into a vein) Pimozide Thioridazine This medication may also interact with the following: Alcohol Amphetamines Aspirin and aspirin-like medications Carbamazepine Certain medications for mental health conditions Certain medications for migraine headache, such as almotriptan, eletriptan, frovatriptan, naratriptan, rizatriptan , sumatriptan, zolmitriptan Certain medications for sleep Certain medications that treat or prevent blood clots, such as warfarin, enoxaparin, dalteparin Cimetidine Diuretics Dofetilide Fentanyl Furazolidone Isoniazid  Lithium Metoprolol NSAIDs, medications for pain and inflammation, such as ibuprofen  or naproxen Other medications that cause heart rhythm changes Procarbazine Rasagiline Supplements, such as St. John's wort, kava kava, valerian Tramadol Tryptophan Ziprasidone This list may not describe all possible interactions. Give your health care  provider a list of all the medicines, herbs, non-prescription drugs, or dietary supplements you use. Also tell them if you smoke, drink alcohol, or use illegal drugs. Some items may interact with your medicine. What should I watch for while using this medication? Tell your care team if your symptoms do not get better or if they get worse. Visit your care team for regular checks on your progress. Because it may take several weeks to see the full effects of this medication, it is important to continue your treatment as prescribed by your care team. Watch for new or worsening thoughts of suicide or depression. This includes sudden changes in mood, behaviors, or thoughts. These changes can happen at any time but are more common in the beginning of treatment or after a change in dose. Call your care team right away if you experience these thoughts or worsening depression. This medication may cause mood and behavior changes, such as anxiety, nervousness, irritability, hostility, restlessness, excitability, hyperactivity, or trouble sleeping. These changes can happen at any time but are more common in the beginning of treatment or after a change in dose. Call your care team right away if you notice any of these symptoms. This medication may affect your coordination, reaction time, or judgment. Do not drive or operate machinery until you know how this medication affects you. Sit up or stand slowly to reduce the risk of dizzy or fainting spells. Drinking alcohol with this medication can increase the risk of these side effects. Your mouth may get dry. Chewing sugarless gum or sucking hard candy and drinking plenty of water may help. Contact  your care team if the problem does not go away or is severe. What side effects may I notice from receiving this medication? Side effects that you should report to your care team as soon as possible: Allergic reactions--skin rash, itching, hives, swelling of the face, lips, tongue,  or throat Bleeding--bloody or black, tar-like stools, red or dark brown urine, vomiting blood or brown material that looks like coffee grounds, small, red or purple spots on skin, unusual bleeding or bruising Heart rhythm changes--fast or irregular heartbeat, dizziness, feeling faint or lightheaded, chest pain, trouble breathing Low sodium level--muscle weakness, fatigue, dizziness, headache, confusion Serotonin syndrome--irritability, confusion, fast or irregular heartbeat, muscle stiffness, twitching muscles, sweating, high fever, seizure, chills, vomiting, diarrhea Sudden eye pain or change in vision such as blurry vision, seeing halos around lights, vision loss Thoughts of suicide or self-harm, worsening mood, feelings of depression Side effects that usually do not require medical attention (report to your care team if they continue or are bothersome): Change in sex drive or performance Diarrhea Excessive sweating Nausea Tremors or shaking Upset stomach This list may not describe all possible side effects. Call your doctor for medical advice about side effects. You may report side effects to FDA at 1-800-FDA-1088. Where should I keep my medication? Keep out of reach of children and pets. Store at room temperature between 15 and 30 degrees C (59 and 86 degrees F). Throw away any unused medication after the expiration date. NOTE: This sheet is a summary. It may not cover all possible information. If you have questions about this medicine, talk to your doctor, pharmacist, or health care provider.  2024 Elsevier/Gold Standard (2021-10-17 00:00:00)Sertraline Tablets What is this medication? SERTRALINE (SER tra leen) treats depression, anxiety, obsessive-compulsive disorder (OCD), post-traumatic stress disorder (PTSD), and premenstrual dysphoric disorder (PMDD). It increases the amount of serotonin in the brain, a hormone that helps regulate mood. It belongs to a group of medications called  SSRIs. This medicine may be used for other purposes; ask your health care provider or pharmacist if you have questions. COMMON BRAND NAME(S): Zoloft What should I tell my care team before I take this medication? They need to know if you have any of these conditions: Bleeding disorders Bipolar disorder or a family history of bipolar disorder Frequently drink alcohol Glaucoma Heart disease High blood pressure History of irregular heartbeat History of low levels of calcium, magnesium, or potassium in the blood Liver disease Receiving electroconvulsive therapy Seizures Suicidal thoughts, plans, or attempt by you or a family member Take medications that prevent or treat blood clots Thyroid disease An unusual or allergic reaction to sertraline, other medications, foods, dyes, or preservatives Pregnant or trying to get pregnant Breastfeeding How should I use this medication? Take this medication by mouth with a glass of water. Take it as directed on the prescription label at the same time every day. You can take it with or without food. If it upsets your stomach, take it with food. Do not take your medication more often than directed. Keep taking this medication unless your care team tells you to stop. Stopping it too quickly can cause serious side effects. It can also make your condition worse. A special MedGuide will be given to you by the pharmacist with each prescription and refill. Be sure to read this information carefully each time. Talk to your care team about the use of this medication in children. While it may be prescribed for children as young as 7 years for selected conditions,  precautions do apply. Overdosage: If you think you have taken too much of this medicine contact a poison control center or emergency room at once. NOTE: This medicine is only for you. Do not share this medicine with others. What if I miss a dose? If you miss a dose, take it as soon as you can. If it is  almost time for your next dose, take only that dose. Do not take double or extra doses. What may interact with this medication? Do not take this medication with any of the following: Cisapride Dronedarone Linezolid MAOIs, such as Carbex, Eldepryl, Marplan, Nardil, and Parnate Methylene blue (injected into a vein) Pimozide Thioridazine This medication may also interact with the following: Alcohol Amphetamines Aspirin and aspirin-like medications Certain medications for fungal infections, such as ketoconazole, fluconazole , posaconazole, itraconazole Certain medications for irregular heart beat, such as flecainide, quinidine, propafenone Certain medications for mental health conditions Certain medications for migraine headaches, such as almotriptan, eletriptan, frovatriptan, naratriptan, rizatriptan , sumatriptan, zolmitriptan Certain medications for seizures, such as carbamazepine, valproic acid, phenytoin Certain medications for sleep Certain medications that prevent or treat blood clots, such as warfarin, enoxaparin, dalteparin Cimetidine Digoxin Diuretics Fentanyl Isoniazid  Lithium NSAIDs, medications for pain and inflammation, such as ibuprofen  or naproxen Other medications that cause heart rhythm changes, such as dofetilide Rasagiline Safinamide Supplements, such as St. John's wort, kava kava, valerian Tolbutamide Tramadol Tryptophan This list may not describe all possible interactions. Give your health care provider a list of all the medicines, herbs, non-prescription drugs, or dietary supplements you use. Also tell them if you smoke, drink alcohol, or use illegal drugs. Some items may interact with your medicine. What should I watch for while using this medication? Tell your care team if your symptoms do not get better or if they get worse. Visit your care team for regular checks on your progress. Because it may take several weeks to see the full effects of this medication,  it is important to continue your treatment as prescribed by your care team. Patients and their families should watch out for new or worsening thoughts of suicide or depression. Also watch out for sudden changes in feelings such as feeling anxious, agitated, panicky, irritable, hostile, aggressive, impulsive, severely restless, overly excited and hyperactive, or not being able to sleep. If this happens, especially at the beginning of treatment or after a change in dose, call your care team. This medication may affect your coordination, reaction time, or judgment. Do not drive or operate machinery until you know how this medication affects you. Sit or stand up slowly to reduce the risk of dizzy or fainting spells. Drinking alcohol with this medication can increase the risk of these side effects. Your mouth may get dry. Chewing sugarless gum or sucking hard candy, and drinking plenty of water may help. Contact your care team if the problem does not go away or is severe. What side effects may I notice from receiving this medication? Side effects that you should report to your care team as soon as possible: Allergic reactions--skin rash, itching, hives, swelling of the face, lips, tongue, or throat Bleeding--bloody or black, tar-like stools, red or dark brown urine, vomiting blood or brown material that looks like coffee grounds, small red or purple spots on skin, unusual bleeding or bruising Heart rhythm changes--fast or irregular heartbeat, dizziness, feeling faint or lightheaded, chest pain, trouble breathing Low sodium level--muscle weakness, fatigue, dizziness, headache, confusion Serotonin syndrome--irritability, confusion, fast or irregular heartbeat, muscle stiffness, twitching muscles, sweating, high  fever, seizure, chills, vomiting, diarrhea Sudden eye pain or change in vision such as blurred vision, seeing halos around lights, vision loss Thoughts of suicide or self-harm, worsening mood Side  effects that usually do not require medical attention (report these to your care team if they continue or are bothersome): Change in sex drive or performance Diarrhea Excessive sweating Nausea Tremors or shaking Upset stomach This list may not describe all possible side effects. Call your doctor for medical advice about side effects. You may report side effects to FDA at 1-800-FDA-1088. Where should I keep my medication? Keep out of the reach of children and pets. Store at room temperature between 20 and 25 degrees C (68 and 77 degrees F). Get rid of any unused medication after the expiration date. To get rid of medications that are no longer needed or expired: Take the medication to a medication take-back program. Check with your pharmacy or law enforcement to find a location. If you cannot return the medication, check the label or package insert to see if the medication should be thrown out in the garbage or flushed down the toilet. If you are not sure, ask your care team. If it is safe to put in the trash, empty the medication out of the container. Mix the medication with cat litter, dirt, coffee grounds, or other unwanted substance. Seal the mixture in a bag or container. Put it in the trash. NOTE: This sheet is a summary. It may not cover all possible information. If you have questions about this medicine, talk to your doctor, pharmacist, or health care provider.  2024 Elsevier/Gold Standard (2021-08-09 00:00:00)

## 2023-09-27 NOTE — Telephone Encounter (Signed)
 Copied from CRM [id number]. Topic: Appointments - Transfer of Care >> Sep 27, 2023  3:57 PM Sophia H wrote: Pt is requesting to transfer FROM: MD Harlene Saddler Pt is requesting to transfer TO: NP Jolene Cannady Reason for requested transfer: Patient states she did not click with provider. Wants to see someone else.  It is the responsibility of the team the patient would like to transfer to ( NP Jolene Cannady) to reach out to the patient if for any reason this transfer is not acceptable.

## 2023-10-01 DIAGNOSIS — Z8639 Personal history of other endocrine, nutritional and metabolic disease: Secondary | ICD-10-CM | POA: Diagnosis not present

## 2023-10-01 DIAGNOSIS — E538 Deficiency of other specified B group vitamins: Secondary | ICD-10-CM | POA: Diagnosis not present

## 2023-10-03 ENCOUNTER — Other Ambulatory Visit: Payer: Self-pay

## 2023-10-03 MED ORDER — CYANOCOBALAMIN 1000 MCG/ML IJ SOLN
1000.0000 ug | INTRAMUSCULAR | 3 refills | Status: DC
  Filled 2023-10-03: qty 4, 28d supply, fill #0

## 2023-10-08 DIAGNOSIS — E538 Deficiency of other specified B group vitamins: Secondary | ICD-10-CM | POA: Diagnosis not present

## 2023-10-15 DIAGNOSIS — E538 Deficiency of other specified B group vitamins: Secondary | ICD-10-CM | POA: Diagnosis not present

## 2023-10-22 ENCOUNTER — Ambulatory Visit: Admitting: Family Medicine

## 2023-10-22 ENCOUNTER — Ambulatory Visit: Payer: Self-pay

## 2023-10-22 DIAGNOSIS — E538 Deficiency of other specified B group vitamins: Secondary | ICD-10-CM | POA: Diagnosis not present

## 2023-10-22 NOTE — Telephone Encounter (Signed)
 FYI Only or Action Required?: FYI only for provider.  Patient was last seen in primary care on 09/10/2023 by Valerie Raisin, MD.  Called Nurse Triage reporting Back Pain.  Symptoms began several days ago.  Interventions attempted: Nothing.  Symptoms are: gradually worsening.  Triage Disposition: See PCP When Office is Open (Within 3 Days)  Patient/caregiver understands and will follow disposition?: Yes     Copied from CRM #[id number]. Topic: Clinical - Red Word Triage >> Oct 22, 2023 10:42 AM Valerie Martinez wrote: Red Word that prompted transfer to Nurse Triage: Patient is calling, states she is having frequent urination, excessive thirst, back pain, especially on right side.    Also reports sneezing and runny nose, but clear mucous. Reason for Disposition  [1] MODERATE back pain (e.g., interferes with normal activities) AND [2] present > 3 days  Answer Assessment - Initial Assessment Questions 1. ONSET: When did the pain begin? (e.g., minutes, hours, days)     Lat week 2. LOCATION: Where does it hurt? (upper, mid or lower back)     Lower back on right side 3. SEVERITY: How bad is the pain?  (e.g., Scale 1-10; mild, moderate, or severe)     mild 4. PATTERN: Is the pain constant? (e.g., yes, no; constant, intermittent)      Comes and goes 5. RADIATION: Does the pain shoot into your legs or somewhere else?     Right side 6. CAUSE:  What do you think is causing the back pain?      unknown 7. BACK OVERUSE:  Any recent lifting of heavy objects, strenuous work or exercise?     Started working out 8. MEDICINES: What have you taken so far for the pain? (e.g., nothing, acetaminophen, NSAIDS)     Bio freeze 9. NEUROLOGIC SYMPTOMS: Do you have any weakness, numbness, or problems with bowel/bladder control?     no 10. OTHER SYMPTOMS: Do you have any other symptoms? (e.g., fever, abdomen pain, burning with urination, blood in urine)       Urinary frequency, excessive  thirst, runny nose 11. PREGNANCY: Is there any chance you are pregnant? When was your last menstrual period?       na  Protocols used: Back Pain-A-AH

## 2023-10-24 MED FILL — Levocetirizine Dihydrochloride Tab 5 MG: ORAL | 90 days supply | Qty: 90 | Fill #1 | Status: AC

## 2023-10-25 ENCOUNTER — Telehealth: Payer: Self-pay | Admitting: Psychiatry

## 2023-10-25 NOTE — Telephone Encounter (Signed)
 Noted

## 2023-10-25 NOTE — Telephone Encounter (Signed)
 Patient called to cancel her appointment stating  She is working with gyn re: vitamin b12 levels and getting injections and is feeling better. She will call as needed if anything changes

## 2023-10-29 ENCOUNTER — Other Ambulatory Visit: Payer: Self-pay

## 2023-10-29 DIAGNOSIS — E538 Deficiency of other specified B group vitamins: Secondary | ICD-10-CM | POA: Diagnosis not present

## 2023-10-29 MED ORDER — CYANOCOBALAMIN 1000 MCG/ML IJ SOLN
1000.0000 ug | INTRAMUSCULAR | 2 refills | Status: AC
  Filled 2023-10-29: qty 1, 30d supply, fill #0

## 2023-10-30 ENCOUNTER — Telehealth: Admitting: Psychiatry

## 2023-11-05 ENCOUNTER — Encounter: Admitting: Student

## 2023-11-21 ENCOUNTER — Ambulatory Visit (INDEPENDENT_AMBULATORY_CARE_PROVIDER_SITE_OTHER): Admitting: Licensed Clinical Social Worker

## 2023-11-21 DIAGNOSIS — F3281 Premenstrual dysphoric disorder: Secondary | ICD-10-CM | POA: Diagnosis not present

## 2023-11-21 DIAGNOSIS — F439 Reaction to severe stress, unspecified: Secondary | ICD-10-CM | POA: Diagnosis not present
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
## 2023-11-21 NOTE — Patient Instructions (Signed)

## 2023-11-23 ENCOUNTER — Ambulatory Visit: Admitting: Nurse Practitioner

## 2023-11-23 ENCOUNTER — Encounter: Payer: Self-pay | Admitting: Nurse Practitioner

## 2023-11-23 VITALS — BP 116/76 | HR 80 | Temp 98.6°F | Resp 15 | Ht 70.0 in | Wt 272.0 lb

## 2023-11-23 DIAGNOSIS — R7989 Other specified abnormal findings of blood chemistry: Secondary | ICD-10-CM | POA: Diagnosis not present

## 2023-11-23 DIAGNOSIS — Z7689 Persons encountering health services in other specified circumstances: Secondary | ICD-10-CM

## 2023-11-23 DIAGNOSIS — Z6839 Body mass index (BMI) 39.0-39.9, adult: Secondary | ICD-10-CM | POA: Diagnosis not present

## 2023-11-23 DIAGNOSIS — E66812 Obesity, class 2: Secondary | ICD-10-CM | POA: Diagnosis not present

## 2023-11-23 DIAGNOSIS — J45990 Exercise induced bronchospasm: Secondary | ICD-10-CM

## 2023-11-23 DIAGNOSIS — M2550 Pain in unspecified joint: Secondary | ICD-10-CM

## 2023-11-23 DIAGNOSIS — Z23 Encounter for immunization: Secondary | ICD-10-CM | POA: Diagnosis not present

## 2023-11-23 DIAGNOSIS — E6609 Other obesity due to excess calories: Secondary | ICD-10-CM | POA: Diagnosis not present

## 2023-11-23 DIAGNOSIS — F3281 Premenstrual dysphoric disorder: Secondary | ICD-10-CM

## 2023-11-23 DIAGNOSIS — Z833 Family history of diabetes mellitus: Secondary | ICD-10-CM | POA: Insufficient documentation

## 2023-11-23 NOTE — Assessment & Plan Note (Signed)
 BMI 39.03. Recommended eating smaller high protein, low fat meals more frequently and exercising 30 mins a day 5 times a week with a goal of 10-15lb weight loss in the next 3 months. Patient voiced their understanding and motivation to adhere to these recommendations.

## 2023-11-23 NOTE — Assessment & Plan Note (Signed)
 Chronic, ongoing.  Diagnosed by GYN. Psychiatry feels she may have Bipolar, but therapy does not suspect this. Duloxetine  caused jittery feeling and nausea.  Will continue therapy sessions at this time. Consider trial of alternate medication in future. Denies SI/I. ?if fibromyalgia due to multiple symptoms presenting with this.

## 2023-11-23 NOTE — Assessment & Plan Note (Signed)
 To multiple areas. ?if fibromyalgia as has headaches, trouble sleeping, stomach issues, fatigue, and mood changes. 8 points positive on exam. Had positive ANA and did see rheumatology.  Will run a few more lupus labs + recheck CMP and CBC. Determine next steps after all return. Could consider trial of low dose Lyrica in future or referral to [facility] rheumatology for second assessment. No family history of lupus.

## 2023-11-23 NOTE — Assessment & Plan Note (Signed)
 Family history and she has concerns for this.  Check A1c and insulin level today.

## 2023-11-23 NOTE — Progress Notes (Signed)
 New Patient Office Visit  Subjective    Patient ID: Valerie Martinez, female    DOB: 20-Jan-2001  Age: 23 y.o. MRN: [id number]  CC:  Chief Complaint  Patient presents with   Establish Care    Previous PCP at [facility]. Prefers female provider. Questions lupus diagnosis.     HPI Valerie Martinez presents for new patient visit to establish care.  Introduced to publishing rights manager role and practice setting.  All questions answered.  Discussed provider/patient relationship and expectations. Was at [facility] with Dr. MARLA prior to coming here. Was there for 3 years.  Has been seeing a lot of doctors over past year and this has been overwhelming.   Family history of diabetes, she is concerned for this. Has underlying asthma, well controlled with inhalers.  ARTHRALGIAS / JOINT ACHES Ongoing issue for some time. Was seen in January by previous PCP, 02/13/23.  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 No history of lupus in her family.  [name] went to rheumatology 03/28/23, she reports that she had 8 of the 10 symptoms they were looking for with lupus and they told her to return if more symptoms present. Her B12 level was low at 169 on 10/01/23.  Did a whole month of weekly shots, but not had rechecked since.  Takes Vitamin D  supplement at home for history of low levels. Every time she works out she gets sick.  Symptoms since November last year: lots of pain/spasms which traveled to joints (it would vary as to joints), stomach issues, if does not eat every 2 hours gets shaky, trouble sleeping (sleep paralysis episodes), sporadic headaches, increase fatigue, occasional joint swelling.  No fevers.  Occasional rash under breast area. No hair loss or ulcers in mouth.   Duration: months Pain: yes Symmetric: yes  7/10 - starts with one side and goes over Quality: dull, aching, and throbbing Frequency: intermittent Context:  worse Decreased function/range of  motion: no Erythema: no Swelling: occasional Heat or warmth: sometimes Morning stiffness: yes - this can last for 2-3 minutes Aggravating factors:  Alleviating factors:  Relief with NSAIDs?: no Treatments attempted:  heat, Aleeve, Tylenol  Involved Joints:     Hands: none    Wrists: yes bilateral R>L    Elbows: no    Shoulders: yes bilateral    Back: yes lower back    Hips: yes bilateral    Knees: yes bilateral    Ankles: yes bilateral    Feet: no  PMDD Was started on Yaz birth control in June 2025 by GYN. They feel she has PMDD.  Feels that the Francia is not working. Has not had a cycle since August. Psychiatry diagnosed with PMDD, Bipolar, PTSD, however she reports therapy does not feel she has Bipolar. Psychiatry recommended counseling, she goes to them Wednesday.  Did try Cymbalta  in past, this made her jittery and nauseous. Mood status: stable Satisfied with current treatment?: none Symptom severity: moderate  Duration of current treatment : chronic Side effects: no Medication compliance: good compliance Psychotherapy/counseling: yes current Depressed mood: yes Anxious mood: yes when tries to sleep Anhedonia: yes Significant weight loss or gain: no Insomnia: yes hard to fall asleep Fatigue: yes Feelings of worthlessness or guilt: sometimes Impaired concentration/indecisiveness: no Suicidal ideations: no Hopelessness: yes Crying spells: yes    11/23/2023    9:09 AM 11/21/2023    1:01 PM 09/27/2023    9:46 AM 09/10/2023  2:51 PM 08/20/2023    1:48 PM  Depression screen PHQ 2/9  Decreased Interest 2   3 2   Down, Depressed, Hopeless 1   3 1   PHQ - 2 Score 3   6 3   Altered sleeping 3   3 3   Tired, decreased energy 2   3 1   Change in appetite 1   1 1   Feeling bad or failure about yourself  0   3 0  Trouble concentrating 1   2 1   Moving slowly or fidgety/restless 1   3 0  Suicidal thoughts 0   0 0  PHQ-9 Score 11   21 9   Difficult doing work/chores Somewhat  difficult   Extremely dIfficult Very difficult     Information is confidential and restricted. Go to Review Flowsheets to unlock data.       11/23/2023    9:10 AM 11/21/2023    1:02 PM 09/27/2023    9:47 AM 09/10/2023    2:51 PM  GAD 7 : Generalized Anxiety Score  Nervous, Anxious, on Edge 0   2  Control/stop worrying 0   1  Worry too much - different things 0   2  Trouble relaxing 2   1  Restless 2   3  Easily annoyed or irritable 1   3  Afraid - awful might happen 0   0  Total GAD 7 Score 5   12  Anxiety Difficulty Somewhat difficult   Extremely difficult     Information is confidential and restricted. Go to Review Flowsheets to unlock data.      Outpatient Encounter Medications as of 11/23/2023  Medication Sig   albuterol  (VENTOLIN  HFA) 108 (90 Base) MCG/ACT inhaler Inhale 2 puffs into the lungs every 4-6 hours as needed for cough/wheeze.   Albuterol  Sulfate (PROAIR  RESPICLICK) 108 (90 Base) MCG/ACT AEPB Inhale 1-2 puffs into the lungs as needed (wheezing, shortness of breath).   azelastine  (ASTELIN ) 0.1 % nasal spray Place 2 sprays into both nostrils 2 (two) times daily. Use in each nostril as directed. For allergy  prevention.   budesonide -formoterol  (SYMBICORT ) 80-4.5 MCG/ACT inhaler Inhale 2 puffs into the lungs as needed up to 12 puffs per day.   cetirizine  (ZYRTEC ) 10 MG tablet 1 tablet Orally Once a day for 30 day(s)   chlorpheniramine (CHLOR-TRIMETON) 4 MG tablet 1 tablet as needed Orally every 6 hrs   cyanocobalamin  (VITAMIN B12) 1000 MCG/ML injection Inject 1 mL (1,000 mcg total) into the skin every 30 (thirty) days.   EPINEPHrine  (EPIPEN  2-PAK) 0.3 mg/0.3 mL IJ SOAJ injection Inject 0.3 mg into the muscle of the outer thigh as needed for anaphylaxis.   fexofenadine (ALLEGRA) 180 MG tablet Take 180 mg by mouth daily.   ipratropium (ATROVENT ) 0.06 % nasal spray Place 2 sprays into both nostrils 4 (four) times daily. As needed short term for congestion   levocetirizine  (XYZAL ) 5 MG tablet Take 1 tablet (5 mg total) by mouth every evening. (Patient taking differently: Take 5 mg by mouth daily as needed.)   montelukast  (SINGULAIR ) 10 MG tablet 1 tablet in the evening Orally Once a day; Duration: 30 days   Olopatadine  HCl 0.2 % SOLN Instill 1 drop into the affected eye 2 (two) times daily.   pantoprazole  (PROTONIX ) 40 MG tablet Take 1 tablet (40 mg total) by mouth 2 (two) times daily before a meal.   Spacer/Aero-Holding Chambers (AEROCHAMBER PLS FLOVU MTHPIECE) DEVI Use as directed with inhalers   tiZANidine  (ZANAFLEX )  4 MG tablet Take 0.5-1 tablets (2-4 mg total) by mouth every 8 (eight) hours as needed for muscle spasms (back pain).   triamcinolone cream (KENALOG) 0.1 % 1 application to affected area Externally Twice a day; Duration: 30 days   [DISCONTINUED] drospirenone -ethinyl estradiol  (YAZ) 3-0.02 MG tablet Take 1 tablet by mouth once daily   [DISCONTINUED] drospirenone -ethinyl estradiol  (YAZ) 3-0.02 MG tablet Take 1 tablet by mouth daily. (Patient not taking: Reported on 09/27/2023)   [DISCONTINUED] EPINEPHrine  0.3 mg/0.3 mL IJ SOAJ injection Inject 0.3 mg into the muscle (outer thigh) as needed for  anaphylaxis.   [DISCONTINUED] hyoscyamine  (LEVSIN  SL) 0.125 MG SL tablet Place 1 tablet (0.125 mg total) under the tongue every 6 (six) hours as needed for abdominal pain   No facility-administered encounter medications on file as of 11/23/2023.    Past Medical History:  Diagnosis Date   Allergy     Asthma    GERD (gastroesophageal reflux disease)    Really been struggling lately    Past Surgical History:  Procedure Laterality Date   WISDOM TOOTH EXTRACTION      Family History  Problem Relation Age of Onset   Asthma Mother    Obesity Mother    Cancer Maternal Grandmother        Colon   Arthritis Maternal Grandmother    Hyperlipidemia Maternal Grandmother    Hypertension Maternal Grandmother    Anemia Maternal Grandmother    Arthritis Maternal  Grandfather    Diabetes Maternal Grandfather    Hypertension Maternal Grandfather    Kidney disease Maternal Grandfather    Heart attack Maternal Grandfather    Breast cancer Neg Hx    Ovarian cancer Neg Hx    Colon cancer Neg Hx    Mental illness Neg Hx     Social History   Socioeconomic History   Marital status: Single    Spouse name: Not on file   Number of children: Not on file   Years of education: Not on file   Highest education level: Associate degree: academic program  Occupational History   Not on file  Tobacco Use   Smoking status: Never   Smokeless tobacco: Never  Vaping Use   Vaping status: Never Used  Substance and Sexual Activity   Alcohol use: Never   Drug use: Never   Sexual activity: Never    Birth control/protection: OCP  Other Topics Concern   Not on file  Social History Narrative   Lives with mom    Social Drivers of Health   Financial Resource Strain: High Risk (11/21/2023)   Overall Financial Resource Strain (CARDIA)    Difficulty of Paying Living Expenses: Very hard  Food Insecurity: Food Insecurity Present (11/21/2023)   Hunger Vital Sign    Worried About Running Out of Food in the Last Year: Sometimes true    Ran Out of Food in the Last Year: Sometimes true  Transportation Needs: No Transportation Needs (11/21/2023)   PRAPARE - Administrator, Civil Service (Medical): No    Lack of Transportation (Non-Medical): No  Physical Activity: Insufficiently Active (11/21/2023)   Exercise Vital Sign    Days of Exercise per Week: 1 day    Minutes of Exercise per Session: 30 min  Stress: Stress Concern Present (11/21/2023)   [employer] of Occupational Health - Occupational Stress Questionnaire    Feeling of Stress: Rather much  Social Connections: Moderately Integrated (11/21/2023)   Social Connection and Isolation Panel    Frequency  of Communication with Friends and Family: More than three times a week    Frequency of Social  Gatherings with Friends and Family: More than three times a week    Attends Religious Services: More than 4 times per year    Active Member of [employer] or Organizations: Yes    Attends Banker Meetings: More than 4 times per year    Marital Status: Never married  Intimate Partner Violence: Not At Risk (11/23/2023)   Humiliation, Afraid, Rape, and Kick questionnaire    Fear of Current or Ex-Partner: No    Emotionally Abused: No    Physically Abused: No    Sexually Abused: No    Review of Systems  Constitutional:  Positive for malaise/fatigue. Negative for chills, diaphoresis, fever and weight loss.  Eyes:  Positive for blurred vision (occasional).  Respiratory:  Negative for cough, shortness of breath and wheezing.   Cardiovascular:  Negative for chest pain, palpitations and leg swelling.  Musculoskeletal:  Positive for joint pain.  Neurological:  Positive for headaches.  Endo/Heme/Allergies: Negative.   Psychiatric/Behavioral:  Positive for depression. Negative for hallucinations, memory loss and suicidal ideas. The patient is nervous/anxious and has insomnia.         Objective    BP 116/76 (BP Location: Left Arm, Patient Position: Sitting, Cuff Size: Large)   Pulse 80   Temp 98.6 F (37 C) (Oral)   Resp 15   Ht 5' 10 (1.778 m)   Wt 272 lb (123.4 kg)   SpO2 98%   BMI 39.03 kg/m   Physical Exam Vitals and nursing note reviewed.  Constitutional:      General: She is awake. She is not in acute distress.    Appearance: She is well-developed and well-groomed. She is obese. She is not ill-appearing or toxic-appearing.  HENT:     Head: Normocephalic.     Right Ear: Hearing and external ear normal.     Left Ear: Hearing and external ear normal.  Eyes:     General: Lids are normal.        Right eye: No discharge.        Left eye: No discharge.     Conjunctiva/sclera: Conjunctivae normal.     Pupils: Pupils are equal, round, and reactive to light.  Neck:      Thyroid: No thyromegaly.     Vascular: No carotid bruit.  Cardiovascular:     Rate and Rhythm: Normal rate and regular rhythm.     Heart sounds: Normal heart sounds. No murmur heard.    No gallop.  Pulmonary:     Effort: Pulmonary effort is normal. No accessory muscle usage or respiratory distress.     Breath sounds: Normal breath sounds.  Abdominal:     General: Bowel sounds are normal. There is no distension.     Palpations: Abdomen is soft.     Tenderness: There is no abdominal tenderness.  Musculoskeletal:     Cervical back: Normal range of motion and neck supple.     Right lower leg: No edema.     Left lower leg: No edema.     Comments: On review of fibromyalgia chart has 8 spots she reports as very painful. Will scan in chart.  Lymphadenopathy:     Cervical: No cervical adenopathy.  Skin:    General: Skin is warm and dry.  Neurological:     Mental Status: She is alert and oriented to person, place, and time.  Deep Tendon Reflexes: Reflexes are normal and symmetric.     Reflex Scores:      Brachioradialis reflexes are 2+ on the right side and 2+ on the left side.      Patellar reflexes are 2+ on the right side and 2+ on the left side. Psychiatric:        Attention and Perception: Attention normal.        Mood and Affect: Mood normal.        Speech: Speech normal.        Behavior: Behavior normal. Behavior is cooperative.        Thought Content: Thought content normal.    Last CBC Lab Results  Component Value Date   WBC 5.8 11/07/2022   HGB 14.0 11/07/2022   HCT 43.0 11/07/2022   MCV 89.2 11/07/2022   MCH 29.0 11/07/2022   RDW 13.5 11/07/2022   PLT 291 11/07/2022   Last metabolic panel Lab Results  Component Value Date   GLUCOSE 108 (H) 11/07/2022   NA 137 11/07/2022   K 4.3 11/07/2022   CL 102 11/07/2022   CO2 28 11/07/2022   BUN 12 11/07/2022   CREATININE 0.81 11/07/2022   EGFR 106 11/07/2022   CALCIUM 9.8 11/07/2022   PROT 7.5 11/07/2022   BILITOT  0.4 11/07/2022   AST 13 11/07/2022   ALT 11 11/07/2022   Last lipids Lab Results  Component Value Date   CHOL 169 11/07/2022   HDL 57 11/07/2022   LDLCALC 97 11/07/2022   TRIG 67 11/07/2022   CHOLHDL 3.0 11/07/2022   Last hemoglobin A1c Lab Results  Component Value Date   HGBA1C 5.1 11/07/2022   Last thyroid functions Lab Results  Component Value Date   TSH 0.73 11/07/2022   FREET4 1.1 01/19/2021   Last vitamin D  Lab Results  Component Value Date   VD25OH 27 (L) 01/11/2023   Last vitamin B12 and Folate Lab Results  Component Value Date   VITAMINB12 408 12/12/2021      Assessment & Plan:   Problem List Items Addressed This Visit       Respiratory   Mild exercise-induced asthma   Chronic, stable. Continue current inhaler regimen and adjust as needed. Monitor for exacerbations.        Other   PMDD (premenstrual dysphoric disorder)   Chronic, ongoing.  Diagnosed by GYN. Psychiatry feels she may have Bipolar, but therapy does not suspect this. Duloxetine  caused jittery feeling and nausea.  Will continue therapy sessions at this time. Consider trial of alternate medication in future. Denies SI/I. ?if fibromyalgia due to multiple symptoms presenting with this.      Relevant Orders   HgB A1c   Insulin, random   Obesity   BMI 39.03. Recommended eating smaller high protein, low fat meals more frequently and exercising 30 mins a day 5 times a week with a goal of 10-15lb weight loss in the next 3 months. Patient voiced their understanding and motivation to adhere to these recommendations.       Multiple joint pain - Primary   To multiple areas. ?if fibromyalgia as has headaches, trouble sleeping, stomach issues, fatigue, and mood changes. 8 points positive on exam. Had positive ANA and did see rheumatology.  Will run a few more lupus labs + recheck CMP and CBC. Determine next steps after all return. Could consider trial of low dose Lyrica in future or referral to  [facility] rheumatology for second assessment. No family history of lupus.  Relevant Orders   Anti-DNA antibody, double-stranded   Lupus Diagnostic Profile   CK (Creatine Kinase)   Vitamin B12   CBC with Differential/Platelet   Comp Met (CMET)   Low vitamin B12 level   Chronic, received injections from GYN. Recheck levels today and determine if able to change to oral supplement daily.      Family history of diabetes mellitus   Family history and she has concerns for this.  Check A1c and insulin level today.      Other Visit Diagnoses       Flu vaccine need       Flu vaccine today, educated patient.   Relevant Orders   Flu vaccine trivalent PF, 6mos and older(Flulaval,Afluria,Fluarix,Fluzone) (Completed)     Encounter to establish care       New to clinic, introduced to clinic setting and provider.       Return in about 4 weeks (around 12/21/2023) for JOINT PAIN AND MOOD.   Drayton Tieu T Rishan Oyama, NP

## 2023-11-23 NOTE — Assessment & Plan Note (Signed)
 Chronic, stable. Continue current inhaler regimen and adjust as needed. Monitor for exacerbations.

## 2023-11-23 NOTE — Assessment & Plan Note (Signed)
 Chronic, received injections from GYN. Recheck levels today and determine if able to change to oral supplement daily.

## 2023-11-24 ENCOUNTER — Ambulatory Visit: Payer: Self-pay | Admitting: Nurse Practitioner

## 2023-11-24 DIAGNOSIS — E88819 Insulin resistance, unspecified: Secondary | ICD-10-CM | POA: Insufficient documentation

## 2023-11-24 NOTE — Progress Notes (Signed)
 Contacted via MyChart  Good morning Valerie Martinez, your labs are returning, still waiting on further auto immune labs: - Insulin level is a little elevated. I suspect you do have some insulin resistance. However, A1c at this time is showing no diabetes or prediabetes. - Kidney function, creatinine and eGFR, remains normal, as is liver function, AST and ALT.  - B12 is still on low side, but in normal range. It is on low side of normal. I would recommend you start taking over the counter Vitamin B12 1000 MCG daily which will offer similar benefit to the injections you were receiving in past. - CK and CBC both normal.  I will alert you when remainder of labs return. Any questions? Keep being stellar!!  Thank you for allowing me to participate in your care.  I appreciate you. Kindest regards, Roosevelt Eimers

## 2023-11-26 ENCOUNTER — Encounter: Admitting: Nurse Practitioner

## 2023-11-26 DIAGNOSIS — F3281 Premenstrual dysphoric disorder: Secondary | ICD-10-CM | POA: Diagnosis not present

## 2023-11-26 DIAGNOSIS — F4310 Post-traumatic stress disorder, unspecified: Secondary | ICD-10-CM | POA: Diagnosis not present

## 2023-11-27 ENCOUNTER — Other Ambulatory Visit: Payer: Self-pay

## 2023-11-27 MED ORDER — ESCITALOPRAM OXALATE 10 MG PO TABS
10.0000 mg | ORAL_TABLET | Freq: Every day | ORAL | 2 refills | Status: DC
  Filled 2023-11-27: qty 30, 30d supply, fill #0

## 2023-11-29 ENCOUNTER — Other Ambulatory Visit: Payer: Self-pay

## 2023-11-29 MED ORDER — VANISHPOINT TUBERCULIN SYRINGE 25G X 1" 1 ML MISC
0 refills | Status: AC
  Filled 2023-11-29: qty 20, 20d supply, fill #0

## 2023-11-30 ENCOUNTER — Other Ambulatory Visit: Payer: Self-pay

## 2023-11-30 ENCOUNTER — Encounter: Payer: Self-pay | Admitting: Nurse Practitioner

## 2023-11-30 DIAGNOSIS — R7689 Other specified abnormal immunological findings in serum: Secondary | ICD-10-CM

## 2023-11-30 DIAGNOSIS — J3089 Other allergic rhinitis: Secondary | ICD-10-CM | POA: Diagnosis not present

## 2023-11-30 DIAGNOSIS — J3081 Allergic rhinitis due to animal (cat) (dog) hair and dander: Secondary | ICD-10-CM | POA: Diagnosis not present

## 2023-11-30 DIAGNOSIS — J45991 Cough variant asthma: Secondary | ICD-10-CM | POA: Diagnosis not present

## 2023-11-30 DIAGNOSIS — J301 Allergic rhinitis due to pollen: Secondary | ICD-10-CM | POA: Diagnosis not present

## 2023-11-30 DIAGNOSIS — M2550 Pain in unspecified joint: Secondary | ICD-10-CM

## 2023-11-30 MED ORDER — TRIAMCINOLONE ACETONIDE 0.1 % EX OINT
1.0000 | TOPICAL_OINTMENT | Freq: Two times a day (BID) | CUTANEOUS | 3 refills | Status: AC | PRN
  Filled 2023-11-30: qty 60, 30d supply, fill #0

## 2023-12-01 NOTE — Progress Notes (Signed)
 Contacted via MyChart  Waiting on one more lab, but overall lupus labs show similar to 9 months ago with mild elevation in Anti-U1. C3 complement a little elevated this check. Could consider return to rheumatology in future. Any questions? Keep being amazing!!  Thank you for allowing me to participate in your care.  I appreciate you. Kindest regards, Serene Kopf

## 2023-12-02 ENCOUNTER — Other Ambulatory Visit: Payer: Self-pay

## 2023-12-02 MED ORDER — DROSPIRENONE-ETHINYL ESTRADIOL 3-0.02 MG PO TABS
1.0000 | ORAL_TABLET | Freq: Every day | ORAL | 5 refills | Status: DC
  Filled 2023-12-02: qty 84, 63d supply, fill #0

## 2023-12-04 ENCOUNTER — Ambulatory Visit: Admitting: Licensed Clinical Social Worker

## 2023-12-04 ENCOUNTER — Encounter: Payer: Self-pay | Admitting: Licensed Clinical Social Worker

## 2023-12-04 ENCOUNTER — Ambulatory Visit (INDEPENDENT_AMBULATORY_CARE_PROVIDER_SITE_OTHER): Admitting: Licensed Clinical Social Worker

## 2023-12-04 DIAGNOSIS — F439 Reaction to severe stress, unspecified: Secondary | ICD-10-CM

## 2023-12-04 DIAGNOSIS — F3281 Premenstrual dysphoric disorder: Secondary | ICD-10-CM | POA: Diagnosis not present

## 2023-12-04 NOTE — Progress Notes (Signed)
 THERAPIST PROGRESS NOTE  Session Time: 8-9:02am  Participation Level: Active  Behavioral Response: CasualAlertDepressed  Type of Therapy: Individual Therapy  Treatment Goals addressed:  Active     BH CCP Acute or Chronic Trauma Reaction     LTG: Elimination of maladaptive behaviors and thinking patterns which interfere with resolution of trauma as evidenced by self report     Start:  11/21/23    Expected End:  02/21/24         LTG: Develop and implement effective coping skills to carry out normal responsibilities and participate constructively in relationships as evidenced by self report     Start:  11/21/23    Expected End:  02/21/24         STG: Valerie Martinez will identify coping strategies to deal with trauma memories and the associated emotional reaction     Start:  11/21/23    Expected End:  02/21/24         Cooperate with trauma-focused psychotherapy techniques to reduce emotional reaction to the traumatic event      Start:  11/21/23         Educate Valerie Martinez that exposure to trauma may result in brain and hormonal changes that can lead to difficulties with memory, learning, emotional regulation, poor impulse control, or depression that can persist     Start:  11/21/23         Work with Valerie Martinez to construct a list of the situations, people, & places that Valerie Martinez evoke the most distressing symptoms; suggest that they keep a journal of instances of stress being triggered     Start:  11/21/23           OP Depression     LTG: Reduce frequency, intensity, and duration of depression symptoms so that daily functioning is improved     Start:  11/21/23    Expected End:  02/21/24         LTG: Increase coping skills to manage depression and improve ability to perform daily activities     Start:  11/21/23    Expected End:  02/21/24         STG: Valerie Martinez will identify cognitive patterns and beliefs that support depression     Start:  11/21/23    Expected End:  02/21/24          LTG: finding balance AEB less anxiety and depression      Start:  11/21/23    Expected End:  02/21/24         Therapist will educate patient on cognitive distortions and the rationale for treatment of depression     Start:  11/21/23         Valerie Martinez will identify 2 cognitive distortions they are currently using and write reframing statements to replace them     Start:  11/21/23         coping Skills      Start:  11/21/23       Will work with the pt using CBT/DBT techniques to help the pt verbalize an understanding of the cognitive, physiological, and behavioral components of depression and its treatment. This will be done by using worksheets, interactive activities, CBT/ABC thought logs, modeling, homework, role playing and journaling. Will work with pt to learn and implement coping skills that result in a reduction of depression and improve daily functioning per pt self-report 3 out of 5 documented sessions.           ProgressTowards Goals: Progressing  Interventions: Other: Safety planning, CBT, Reframing   Summary: Valerie Martinez is a 23 y.o. female who presents with Symptoms include, but are not limited to, depressed mood, anhedonia, insomnia, fatigue, difficulty concentrating, decreased appetite, mood lability, excessive worry, and occasional visual disturbances, such as seeing movement out of the corner of her eye. Pt was oriented times 5. Pt was cooperative and engaged. Pt denies HI/AVH.   The patient presented as tearful, stating that she has experienced suicidal ideation since her intake appointment. She often thinks, I do not want to wake up anymore. The patient reported that she reached out to the suicide hotline twice since her last appointment on October 26 and again on Sunday, November 9. During her appointment, the patient denied having a plan or intent to act on these thoughts, and confirms she did not have plan or intent during previous episodes.   She  mentioned that situational events have caused her to feel overwhelmed but noted that she has been able to regulate her emotions and has reached out to her mother for additional support. Medications and other means have been removed from access. The clinician engaged in safety planning with the patient and explored additional support options, introducing her to the Intensive Outpatient Program (IOP) group and Dialectical Behavior Therapy (DBT) group. However, the patient expressed that joining a group at this time would not be effective due to transportation barriers and time limitations from caring for her grandmother. The clinician worked with the patient to address these barriers, however. Pt refused.   The patient also expressed hesitation about making changes to her medication, including antidepressants, due to personal beliefs about medication and a preference for natural approaches.  The patient continues to endorse difficulties with independence due to visual impairments, stating that she struggles to drive because she cannot see long distances and often perceives objects in her peripheral vision. She noted that these changes began when her grandmother was hospitalized. The clinician assessed for patterns and mentioned that the patient has been evaluated by her eye doctor.  Additionally, the patient identified triggers that include feelings of worthlessness and failure, stemming from her inability to assist her mother. She expressed a desire for approval from others due to low self-esteem. The clinician worked with her to challenge this perspective.  Finally, the patient expressed a desire to work on her negative beliefs of men, identifying that lack of access to services serves as a barrier to socializing and participating in her faith. The patient and clinician will continue to address these symptoms and plan for upcoming appointments through [facility] Desensitization and Reprocessing (EMDR)  therapy.    Pt states that he/she has suicidal thoughts without a plan but denies intent. Pt reports identified positive factors such as:   [] Attitudes [x] strong beliefs about the meaning and value of life [] Social skills [] problem-solving skills [] anger management [x] Good health and access to mental and physical health care [x] Strong connections to friends and family as well as supportive significant others [x] Cultural, religious or spiritual beliefs that discourage suicide [] A healthy fear of risky behaviors and pain [] Hope for the future--optimism [] Sobriety [] Medical compliance and a sense of the importance of health and wellness [] Impulse control [] Strong sense of self-worth or self-esteem [] Sense of personal control or determination [x] Access to a variety of clinical interventions/ support for seeking help [] Coping skills [] Resiliency [x] Reasons for living [] Being married or a parent [x] Strong relationships, particularly with family members [] Opportunities to participate in and contribute to school or community projects and activities [  x]A reasonably safe and stable environment [x] Restricted access to lethal means [x] Responsibilities and duties to others [] Pets   Cln. and pt completed a safety plan. Pt was informed of IOP and PHP outpatient groups. Pt accepted/denied stating, she did not feel she would be able to participate in a group effectively at this time.SABRA [place]. provided information for the suicide hotline, [facility] and [facility]. Pt. reports he/she will seek out help from one of the resources or seek out support by a contact listed on her safety plan before attempting his/her plan. Pt was provided a copy of the safety plan.    Suicidal/Homicidal: Yeswithout intent/plan  Therapist Response: Clinician utilized active listening to create a safe space for patient to process recent stressors and assess for safety  since last session.  Clinician safety plan with patient and safety plan was signed and shared with the patient.  Patient shared this plan with her mother and resources were shared with patient.  Clinician and patient explored coping mechanisms as well as triggers for suicidal ideations.  Plan: Return again in 2 weeks.  Diagnosis: PMDD (premenstrual dysphoric disorder)  Trauma and stressor-related disorder   Collaboration of Care: Other Meet with LCSW per availability  Patient/Guardian was advised Release of Information must be obtained prior to any record release in order to collaborate their care with an outside provider. Patient/Guardian was advised if they have not already done so to contact the registration department to sign all necessary forms in order for us  to release information regarding their care.   Consent: Patient/Guardian gives verbal consent for treatment and assignment of benefits for services provided during this visit. Patient/Guardian expressed understanding and agreed to proceed.   Evalene KATHEE Husband, LCSW 12/04/2023

## 2023-12-06 DIAGNOSIS — R21 Rash and other nonspecific skin eruption: Secondary | ICD-10-CM | POA: Diagnosis not present

## 2023-12-06 LAB — CBC WITH DIFFERENTIAL/PLATELET
Basophils Absolute: 0 x10E3/uL (ref 0.0–0.2)
Basos: 1 %
EOS (ABSOLUTE): 0 x10E3/uL (ref 0.0–0.4)
Eos: 1 %
Hematocrit: 39 % (ref 34.0–46.6)
Hemoglobin: 13 g/dL (ref 11.1–15.9)
Immature Grans (Abs): 0 x10E3/uL (ref 0.0–0.1)
Immature Granulocytes: 0 %
Lymphocytes Absolute: 1.8 x10E3/uL (ref 0.7–3.1)
Lymphs: 32 %
MCH: 29.5 pg (ref 26.6–33.0)
MCHC: 33.3 g/dL (ref 31.5–35.7)
MCV: 88 fL (ref 79–97)
Monocytes Absolute: 0.5 x10E3/uL (ref 0.1–0.9)
Monocytes: 10 %
Neutrophils Absolute: 3.1 x10E3/uL (ref 1.4–7.0)
Neutrophils: 56 %
Platelets: 281 x10E3/uL (ref 150–450)
RBC: 4.41 x10E6/uL (ref 3.77–5.28)
RDW: 12.9 % (ref 11.7–15.4)
WBC: 5.5 x10E3/uL (ref 3.4–10.8)

## 2023-12-06 LAB — LUPUS DIAGNOSTIC PROFILE
Anti-Chromatin Ab, IgG (RDL): <20 U (ref <20)
Anti-La (SS-B) Ab (RDL): <20 U (ref <20)
Anti-Nuclear Ab by IFA (RDL): POSITIVE — AB
Anti-Ro (SS-A) Ab (RDL): <20 U (ref <20)
Anti-Sm Ab (RDL): <20 U (ref <20)
Anti-U1 RNP Ab (RDL): 66 U — ABNORMAL HIGH (ref <20)
Anti-dsDNA Ab by Farr(RDL): <8 [IU]/mL (ref <8.0)
C3 Complement (RDL): 202 mg/dL — ABNORMAL HIGH (ref 90–180)
C4 Complement (RDL): 39 mg/dL (ref 10–40)

## 2023-12-06 LAB — COMPREHENSIVE METABOLIC PANEL WITH GFR
ALT: 14 IU/L (ref 0–32)
AST: 15 IU/L (ref 0–40)
Albumin: 4.3 g/dL (ref 4.0–5.0)
Alkaline Phosphatase: 40 IU/L — ABNORMAL LOW (ref 41–116)
BUN/Creatinine Ratio: 11 (ref 9–23)
BUN: 10 mg/dL (ref 6–20)
Bilirubin Total: 0.4 mg/dL (ref 0.0–1.2)
CO2: 22 mmol/L (ref 20–29)
Calcium: 9.7 mg/dL (ref 8.7–10.2)
Chloride: 102 mmol/L (ref 96–106)
Creatinine, Ser: 0.94 mg/dL (ref 0.57–1.00)
Globulin, Total: 3.2 g/dL (ref 1.5–4.5)
Glucose: 107 mg/dL — ABNORMAL HIGH (ref 70–99)
Potassium: 4.3 mmol/L (ref 3.5–5.2)
Sodium: 139 mmol/L (ref 134–144)
Total Protein: 7.5 g/dL (ref 6.0–8.5)
eGFR: 88 mL/min/1.73 (ref >59)

## 2023-12-06 LAB — INSULIN, RANDOM: INSULIN: 26.5 u[IU]/mL — ABNORMAL HIGH (ref 2.6–24.9)

## 2023-12-06 LAB — HEMOGLOBIN A1C
Est. average glucose Bld gHb Est-mCnc: 97 mg/dL
Hgb A1c MFr Bld: 5 % (ref 4.8–5.6)

## 2023-12-06 LAB — ANA TITER AND PATTERN: Speckled Pattern: 1:160 {titer} — ABNORMAL HIGH

## 2023-12-06 LAB — CK: Total CK: 169 U/L (ref 32–182)

## 2023-12-06 LAB — VITAMIN B12: Vitamin B-12: 394 pg/mL (ref 232–1245)

## 2023-12-06 LAB — ANTI-DNA ANTIBODY, DOUBLE-STRANDED: dsDNA Ab: 1 [IU]/mL (ref 0–9)

## 2023-12-11 ENCOUNTER — Encounter: Payer: Self-pay | Admitting: Licensed Clinical Social Worker

## 2023-12-11 ENCOUNTER — Ambulatory Visit (INDEPENDENT_AMBULATORY_CARE_PROVIDER_SITE_OTHER): Admitting: Licensed Clinical Social Worker

## 2023-12-11 DIAGNOSIS — F3281 Premenstrual dysphoric disorder: Secondary | ICD-10-CM | POA: Diagnosis not present

## 2023-12-11 DIAGNOSIS — F439 Reaction to severe stress, unspecified: Secondary | ICD-10-CM

## 2023-12-11 NOTE — Progress Notes (Signed)
 THERAPIST PROGRESS NOTE  Session Time: 9:01-9:45am   Participation Level: Active   Behavioral Response: CasualAlertDepressed   Type of Therapy: Individual Therapy   Treatment Goals addressed:  Active       BH CCP Acute or Chronic Trauma Reaction       LTG: Elimination of maladaptive behaviors and thinking patterns which interfere with resolution of trauma as evidenced by self report       Start:  11/21/23    Expected End:  02/21/24           LTG: Develop and implement effective coping skills to carry out normal responsibilities and participate constructively in relationships as evidenced by self report       Start:  11/21/23    Expected End:  02/21/24           STG: Shandon will identify coping strategies to deal with trauma memories and the associated emotional reaction       Start:  11/21/23    Expected End:  02/21/24           Cooperate with trauma-focused psychotherapy techniques to reduce emotional reaction to the traumatic event        Start:  11/21/23           Educate Prestyn that exposure to trauma may result in brain and hormonal changes that can lead to difficulties with memory, learning, emotional regulation, poor impulse control, or depression that can persist       Start:  11/21/23           Work with [employer] to construct a list of the situations, people, & places that [facility] evoke the most distressing symptoms; suggest that they keep a journal of instances of stress being triggered       Start:  11/21/23             OP Depression       LTG: Reduce frequency, intensity, and duration of depression symptoms so that daily functioning is improved       Start:  11/21/23    Expected End:  02/21/24           LTG: Increase coping skills to manage depression and improve ability to perform daily activities       Start:  11/21/23    Expected End:  02/21/24           STG: Ashleigh will identify cognitive patterns and beliefs that support depression        Start:  11/21/23    Expected End:  02/21/24           LTG: finding balance AEB less anxiety and depression        Start:  11/21/23    Expected End:  02/21/24           Therapist will educate patient on cognitive distortions and the rationale for treatment of depression       Start:  11/21/23           Seraya will identify 2 cognitive distortions they are currently using and write reframing statements to replace them       Start:  11/21/23           coping Skills        Start:  11/21/23       Will work with the pt using CBT/DBT techniques to help the pt verbalize an understanding of the cognitive, physiological, and behavioral components of depression and its treatment. This will  be done by using worksheets, interactive activities, CBT/ABC thought logs, modeling, homework, role playing and journaling. Will work with pt to learn and implement coping skills that result in a reduction of depression and improve daily functioning per pt self-report 3 out of 5 documented sessions.            ProgressTowards Goals: Progressing   Interventions: EMDR, Mindfulness   Summary: Anuja Manka is a 23 y.o. female who presents with Symptoms include, but are not limited to, depressed mood, anhedonia, insomnia, fatigue, difficulty concentrating, decreased appetite, mood lability, excessive worry, and occasional visual disturbances, such as seeing movement out of the corner of her eye. Pt was oriented times 5. Pt was cooperative and engaged. Pt denies HI/AVH.   Patient presented in a better mood for today's session citing new realizations and improvements with suicidal ideations.  Patient reports she has been coping through reflections better understanding her triggers for emotional liability.  Patient reports she also contributes mental health changes as a result of starting a new pack of birth control.  Patient has observed a pattern of mood improvements with her first week of birth control  while her third week (the week before her period) results and worsening mood.  Clinician and patient began EMDR through resourcing.  Patient attempted to engage in breathing techniques but reports her asthma led her to feel lightheaded.  Therefore, clinician continued on with other grounding techniques such as the 5 senses grounding techniques, constructing her peaceful place, constructing her container.  Patient will practice the skills routinely until her next appointment.  For next session, patient will begin constructing her TSP for EMDR.    Suicidal/Homicidal: no no plan or intent    Therapist Response: Clinician utilized active listening to create a safe space for patient to process recent stressors and assess for safety since last session.  Clinician safety plan with patient and safety plan was signed and shared with the patient.  Patient shared this plan with her mother and resources were shared with patient.  Clinician and patient explored coping mechanisms as well as triggers for suicidal ideations.  Began resourcing with patient through EMDR.   Plan: Return again in 2 weeks.   Diagnosis: PMDD (premenstrual dysphoric disorder)   Trauma and stressor-related disorder     Collaboration of Care: Other Meet with LCSW per availability  Patient/Guardian was advised Release of Information must be obtained prior to any record release in order to collaborate their care with an outside provider. Patient/Guardian was advised if they have not already done so to contact the registration department to sign all necessary forms in order for us  to release information regarding their care.   Consent: Patient/Guardian gives verbal consent for treatment and assignment of benefits for services provided during this visit. Patient/Guardian expressed understanding and agreed to proceed.   Evalene KATHEE Husband, LCSW 12/11/2023

## 2023-12-15 MED FILL — Pantoprazole Sodium EC Tab 40 MG (Base Equiv): ORAL | 90 days supply | Qty: 180 | Fill #1 | Status: AC

## 2023-12-16 NOTE — Patient Instructions (Incomplete)
 Referral sent to [facility]  Phone: [phone] Fax:[phone]  Be Involved in Caring For Your Health:  Taking Medications When medications are taken as directed, they can greatly improve your health. But if they are not taken as prescribed, they may not work. In some cases, not taking them correctly can be harmful. To help ensure your treatment remains effective and safe, understand your medications and how to take them. Bring your medications to each visit for review by your provider.  Your lab results, notes, and after visit summary will be available on My Chart. We strongly encourage you to use this feature. If lab results are abnormal the clinic will contact you with the appropriate steps. If the clinic does not contact you assume the results are satisfactory. You can always view your results on My Chart. If you have questions regarding your health or results, please contact the clinic during office hours. You can also ask questions on My Chart.  We at [facility] are grateful that you chose us  to provide your care. We strive to provide evidence-based and compassionate care and are always looking for feedback. If you get a survey from the clinic please complete this so we can hear your opinions.   Joint Pain  Joint pain can be caused by many things. It may go away if you follow instructions from your health care provider for taking care of yourself at home. Sometimes, you may need more treatment. Joint pain can be caused by: Bruises at the area of the joint. An injury caused by movements that are repeated. Wear and tear on the joint as you get older. Buildup of uric acid crystals in the joint. This is also called gout. Irritation and swelling of the joint. Types of arthritis. Infections of the joint or of the bone. Your provider may tell you to take pain medicine or wear an elastic bandage, sling, or splint. If your joint pain continues, you may need lab or imaging  tests to find the cause of your joint pain. Follow these instructions at home: If you have an elastic bandage, sling, or splint that can be taken off: Wear the bandage, sling, or splint as told by your provider. Take it off only if your provider says you can. Check the skin under and around it every day. Tell your provider if you see problems. Loosen it if your fingers or toes tingle, are numb, or turn cold and blue. Keep it clean and dry. Ask your provider if you should remove it before bathing. If the bandage, sling, or splint is not waterproof: Do not let it get wet. Cover it when you take a bath or shower. Use a cover that does not let any water in. Managing pain, stiffness, and swelling     If told, put ice on the area. If you have an elastic bandage, sling, or splint that you can take off, remove it as told. Put ice in a plastic bag. Place a towel between your skin and the bag. Leave the ice on for 20 minutes, 2-3 times a day. If told, put heat on the area. Do this as often as told. Use the heat source that your provider recommends, such as a moist heat pack or a heating pad. Place a towel between your skin and the heat source. Leave the heat on for 20-30 minutes. If your skin turns bright red, take off the ice or heat right away to prevent skin damage. The risk of damage is  higher if you can't feel pain, heat, or cold. Move your fingers or toes often to reduce stiffness and swelling. Raise the injured area above the level of your heart while you're sitting or lying down. Use a pillow to support the painful area as needed. Activity Rest the painful joint as told. Do not do things that cause pain or make pain worse. Begin exercising or stretching the affected area as told by your provider. Return to normal activities when you are told. Ask what things are safe for you to do. General instructions Take your medicines as told by your provider. Treatment may include medicines for  pain and swelling that are taken by mouth or applied to the skin. Do not smoke, vape, or use products with nicotine or tobacco in them. If you need help quitting, talk with your provider. Keep all follow-up visits. Your provider will want to check on your condition. Contact a health care provider if: You have pain that does not get better with medicine. Your joint pain does not improve within 3 days. You have more bruising or swelling. You have a fever. You lose 10 lb (4.5 kg) or more without trying. Get help right away if: You cannot move the joint. Your fingers or toes tingle, become numb, or turn cold and blue. You have a fever along with a joint that's red, warm, and swollen. This information is not intended to replace advice given to you by your health care provider. Make sure you discuss any questions you have with your health care provider. Document Revised: 10/12/2022 Document Reviewed: 03/24/2022 Elsevier Patient Education  2024 [employer].

## 2023-12-19 ENCOUNTER — Ambulatory Visit: Admitting: Nurse Practitioner

## 2023-12-19 ENCOUNTER — Encounter: Payer: Self-pay | Admitting: Nurse Practitioner

## 2023-12-19 ENCOUNTER — Other Ambulatory Visit: Payer: Self-pay

## 2023-12-19 VITALS — BP 113/84 | HR 69 | Temp 98.3°F | Resp 15 | Ht 69.06 in | Wt 274.0 lb

## 2023-12-19 DIAGNOSIS — E6609 Other obesity due to excess calories: Secondary | ICD-10-CM | POA: Diagnosis not present

## 2023-12-19 DIAGNOSIS — F3281 Premenstrual dysphoric disorder: Secondary | ICD-10-CM | POA: Diagnosis not present

## 2023-12-19 DIAGNOSIS — M2550 Pain in unspecified joint: Secondary | ICD-10-CM

## 2023-12-19 DIAGNOSIS — F439 Reaction to severe stress, unspecified: Secondary | ICD-10-CM | POA: Diagnosis not present

## 2023-12-19 DIAGNOSIS — E66812 Obesity, class 2: Secondary | ICD-10-CM

## 2023-12-19 DIAGNOSIS — Z6839 Body mass index (BMI) 39.0-39.9, adult: Secondary | ICD-10-CM

## 2023-12-19 MED ORDER — MELOXICAM 15 MG PO TABS
15.0000 mg | ORAL_TABLET | Freq: Every day | ORAL | 1 refills | Status: DC
  Filled 2023-12-19: qty 90, 90d supply, fill #0

## 2023-12-19 MED ORDER — METHOCARBAMOL 500 MG PO TABS
500.0000 mg | ORAL_TABLET | Freq: Three times a day (TID) | ORAL | 1 refills | Status: AC | PRN
  Filled 2023-12-19: qty 90, 30d supply, fill #0

## 2023-12-19 NOTE — Assessment & Plan Note (Signed)
 Chronic, ongoing.  Diagnosed by GYN. Psychiatry feels she may have Bipolar, but therapy does not suspect this. Duloxetine  caused jittery feeling and nausea.  Will continue therapy sessions at this time. Consider trial of alternate medication in future. Denies SI/I. ?if fibromyalgia due to multiple symptoms presenting with this.

## 2023-12-19 NOTE — Progress Notes (Signed)
 BP 113/84 (BP Location: Left Arm, Patient Position: Sitting, Cuff Size: Large)   Pulse 69   Temp 98.3 F (36.8 C) (Oral)   Resp 15   Ht 5' 9.06 (1.754 m)   Wt 274 lb (124.3 kg)   LMP 08/27/2023 (Approximate)   SpO2 98%   BMI 40.40 kg/m    Subjective:    Patient ID: Valerie Martinez, female    DOB: Oct 02, 2000, 23 y.o.   MRN: [id number]  HPI: Valerie Martinez is a 23 y.o. female  Chief Complaint  Patient presents with   Joint Pain    Feels it has gotten worse, more pain everywhere. Pain in joints but some back spasms.    Mood    Feels stable but struggling with grandma's health on hospice. Might consider switching therapist to someone who aligns with her beliefs more.    ARTHRALGIAS / JOINT ACHES Follow-up today for joint pain. Continues to show positive ANA. Did see rheumatology 03/28/23, but per her report they told her to return when more symptoms present. Has referral to alternate rheumatologist for second opinion. She currently feels pains are getting worse and some back spasms. Cannot take muscle relaxer and Benadryl due to it making her sleepy. Flexeril and Baclofent caused fatigue too.  HISTORY: Symptoms since November 2024: lots of pain/spasms which traveled to joints (it would vary as to joints), stomach issues, if does not eat every 2 hours gets shaky, trouble sleeping (sleep paralysis episodes), sporadic headaches, increase fatigue, occasional joint swelling. No fevers. Occasional rash under breast area. No hair loss or ulcers in mouth.  Duration: months Pain: yes Symmetric: yes  10/10 at worst, 4/10 at present Quality: dull, aching, and throbbing Frequency: intermittent Context:  worse Decreased function/range of motion: no Erythema: no Swelling: occasional Heat or warmth: sometimes Morning stiffness: yes - can last for 10 to 15 minutes Aggravating factors: constant movement Alleviating factors: Tylenol and Aleeve Relief with NSAIDs?: no Treatments attempted:  heat, Aleeve, Tylenol  Involved Joints:     Hands: none    Wrists: yes bilateral R>L    Elbows: no    Shoulders: yes bilateral    Back: yes lower back    Hips: yes bilateral    Knees: yes bilateral    Ankles: yes bilateral  PMDD AND PTSD Taking Yaz ordered by GYN June 2025. Never took Lexapro  that GYN ordered. Grandmother is under hospice and this is stressful for her. She is thinking about changing therapist to someone that aligns more with her beliefs. Last visit 12/11/23. Has not been attending church as much recently, misses that community.   Did try Cymbalta  in past, this made her jittery and nauseous.  Mood status: stable Satisfied with current treatment?: yes Symptom severity: mild  Duration of current treatment : chronic Side effects: no Medication compliance: good compliance Psychotherapy/counseling: yes current Previous psychiatric medications: Duloxetine  Depressed mood: no, not within the last two weeks Anxious mood: no Anhedonia: no Significant weight loss or gain: no Insomnia: yes hard to stay asleep Fatigue: yes Feelings of worthlessness or guilt: yes Impaired concentration/indecisiveness: no Suicidal ideations: no Hopelessness: yes Crying spells: yes    12/19/2023   11:16 AM 11/23/2023    9:09 AM 11/21/2023    1:01 PM 09/27/2023    9:46 AM 09/10/2023    2:51 PM  Depression screen PHQ 2/9  Decreased Interest 1 2   3   Down, Depressed, Hopeless 0 1   3  PHQ - 2 Score 1 3  6  Altered sleeping 2 3   3   Tired, decreased energy 1 2   3   Change in appetite 1 1   1   Feeling bad or failure about yourself  1 0   3  Trouble concentrating 0 1   2  Moving slowly or fidgety/restless 0 1   3  Suicidal thoughts 0 0   0  PHQ-9 Score 6 11    21    Difficult doing work/chores Somewhat difficult Somewhat difficult   Extremely dIfficult     Information is confidential and restricted. Go to Review Flowsheets to unlock data.   Data saved with a previous flowsheet row  definition       12/19/2023   11:16 AM 11/23/2023    9:10 AM 11/21/2023    1:02 PM 09/27/2023    9:47 AM  GAD 7 : Generalized Anxiety Score  Nervous, Anxious, on Edge 0 0    Control/stop worrying 0 0    Worry too much - different things 1 0    Trouble relaxing 1 2    Restless 0 2    Easily annoyed or irritable 2 1    Afraid - awful might happen 1 0    Total GAD 7 Score 5 5    Anxiety Difficulty  Somewhat difficult       Information is confidential and restricted. Go to Review Flowsheets to unlock data.    Relevant past medical, surgical, family and social history reviewed and updated as indicated. Interim medical history since our last visit reviewed. Allergies and medications reviewed and updated.  Review of Systems  Constitutional:  Positive for fatigue. Negative for activity change, appetite change, chills and fever.  Respiratory:  Negative for cough, chest tightness, shortness of breath and wheezing.   Cardiovascular:  Negative for chest pain, palpitations and leg swelling.  Gastrointestinal:  Positive for diarrhea.  Musculoskeletal:  Positive for arthralgias.  Neurological: Negative.   Psychiatric/Behavioral:  Positive for sleep disturbance. Negative for decreased concentration, self-injury and suicidal ideas. The patient is not nervous/anxious.     Per HPI unless specifically indicated above     Objective:    BP 113/84 (BP Location: Left Arm, Patient Position: Sitting, Cuff Size: Large)   Pulse 69   Temp 98.3 F (36.8 C) (Oral)   Resp 15   Ht 5' 9.06 (1.754 m)   Wt 274 lb (124.3 kg)   LMP 08/27/2023 (Approximate)   SpO2 98%   BMI 40.40 kg/m   Wt Readings from Last 3 Encounters:  12/19/23 274 lb (124.3 kg)  11/23/23 272 lb (123.4 kg)  09/10/23 271 lb (122.9 kg)    Physical Exam Vitals and nursing note reviewed.  Constitutional:      General: She is awake. She is not in acute distress.    Appearance: She is well-developed and well-groomed. She is obese.  She is not ill-appearing or toxic-appearing.  HENT:     Head: Normocephalic.     Right Ear: Hearing and external ear normal.     Left Ear: Hearing and external ear normal.  Eyes:     General: Lids are normal.        Right eye: No discharge.        Left eye: No discharge.     Conjunctiva/sclera: Conjunctivae normal.     Pupils: Pupils are equal, round, and reactive to light.  Neck:     Thyroid: No thyromegaly.     Vascular: No carotid bruit.  Cardiovascular:  Rate and Rhythm: Normal rate and regular rhythm.     Heart sounds: Normal heart sounds. No murmur heard.    No gallop.  Pulmonary:     Effort: Pulmonary effort is normal. No accessory muscle usage or respiratory distress.     Breath sounds: Normal breath sounds.  Abdominal:     General: Bowel sounds are normal. There is no distension.     Palpations: Abdomen is soft.     Tenderness: There is no abdominal tenderness.  Musculoskeletal:     Cervical back: Normal range of motion and neck supple.     Right lower leg: No edema.     Left lower leg: No edema.  Lymphadenopathy:     Cervical: No cervical adenopathy.  Skin:    General: Skin is warm and dry.  Neurological:     Mental Status: She is alert and oriented to person, place, and time.     Deep Tendon Reflexes: Reflexes are normal and symmetric.     Reflex Scores:      Brachioradialis reflexes are 2+ on the right side and 2+ on the left side.      Patellar reflexes are 2+ on the right side and 2+ on the left side. Psychiatric:        Attention and Perception: Attention normal.        Mood and Affect: Mood normal.        Speech: Speech normal.        Behavior: Behavior normal. Behavior is cooperative.        Thought Content: Thought content normal.     Results for orders placed or performed in visit on 11/23/23  Anti-DNA antibody, double-stranded   Collection Time: 11/23/23 10:55 AM  Result Value Ref Range   dsDNA Ab 1 0 - 9 IU/mL  Lupus Diagnostic Profile    Collection Time: 11/23/23 10:55 AM  Result Value Ref Range   Anti-Nuclear Ab by IFA (RDL) Positive (A) Negative   Anti-dsDNA Ab by Farr(RDL) <8.0 <8.0 IU/mL   Anti-Sm Ab (RDL) <20 <20 Units   Anti-U1 RNP Ab (RDL) 66 (H) <20 Units   Anti-Ro (SS-A) Ab (RDL) <20 <20 Units   Anti-La (SS-B) Ab (RDL) <20 <20 Units   Anti-Chromatin Ab, IgG (RDL) <20 <20 Units   C3 Complement (RDL) 202 (H) 90 - 180 mg/dL   C4 Complement (RDL) 39 10 - 40 mg/dL  CK (Creatine Kinase)   Collection Time: 11/23/23 10:55 AM  Result Value Ref Range   Total CK 169 32 - 182 U/L  HgB A1c   Collection Time: 11/23/23 10:55 AM  Result Value Ref Range   Hgb A1c MFr Bld 5.0 4.8 - 5.6 %   Est. average glucose Bld gHb Est-mCnc 97 mg/dL  Insulin , random   Collection Time: 11/23/23 10:55 AM  Result Value Ref Range   INSULIN  26.5 (H) 2.6 - 24.9 uIU/mL  Vitamin B12   Collection Time: 11/23/23 10:55 AM  Result Value Ref Range   Vitamin B-12 394 232 - 1,245 pg/mL  CBC with Differential/Platelet   Collection Time: 11/23/23 10:55 AM  Result Value Ref Range   WBC 5.5 3.4 - 10.8 x10E3/uL   RBC 4.41 3.77 - 5.28 x10E6/uL   Hemoglobin 13.0 11.1 - 15.9 g/dL   Hematocrit 60.9 65.9 - 46.6 %   MCV 88 79 - 97 fL   MCH 29.5 26.6 - 33.0 pg   MCHC 33.3 31.5 - 35.7 g/dL   RDW 87.0 88.2 -  15.4 %   Platelets 281 150 - 450 x10E3/uL   Neutrophils 56 Not Estab. %   Lymphs 32 Not Estab. %   Monocytes 10 Not Estab. %   Eos 1 Not Estab. %   Basos 1 Not Estab. %   Neutrophils Absolute 3.1 1.4 - 7.0 x10E3/uL   Lymphocytes Absolute 1.8 0.7 - 3.1 x10E3/uL   Monocytes Absolute 0.5 0.1 - 0.9 x10E3/uL   EOS (ABSOLUTE) 0.0 0.0 - 0.4 x10E3/uL   Basophils Absolute 0.0 0.0 - 0.2 x10E3/uL   Immature Granulocytes 0 Not Estab. %   Immature Grans (Abs) 0.0 0.0 - 0.1 x10E3/uL  Comp Met (CMET)   Collection Time: 11/23/23 10:55 AM  Result Value Ref Range   Glucose 107 (H) 70 - 99 mg/dL   BUN 10 6 - 20 mg/dL   Creatinine, Ser 9.05 0.57 - 1.00 mg/dL    eGFR 88 >40 fO/fpw/8.26   BUN/Creatinine Ratio 11 9 - 23   Sodium 139 134 - 144 mmol/L   Potassium 4.3 3.5 - 5.2 mmol/L   Chloride 102 96 - 106 mmol/L   CO2 22 20 - 29 mmol/L   Calcium 9.7 8.7 - 10.2 mg/dL   Total Protein 7.5 6.0 - 8.5 g/dL   Albumin 4.3 4.0 - 5.0 g/dL   Globulin, Total 3.2 1.5 - 4.5 g/dL   Bilirubin Total 0.4 0.0 - 1.2 mg/dL   Alkaline Phosphatase 40 (L) 41 - 116 IU/L   AST 15 0 - 40 IU/L   ALT 14 0 - 32 IU/L  ANA Titer and Pattern   Collection Time: 11/23/23 10:55 AM  Result Value Ref Range   Speckled Pattern 1:160 (H) <1:40   Note: Comment       Assessment & Plan:   Problem List Items Addressed This Visit       Other   Trauma and stressor-related disorder   Will continue therapy at this time, would benefit from EMDR.      PMDD (premenstrual dysphoric disorder)   Chronic, ongoing.  Diagnosed by GYN. Psychiatry feels she may have Bipolar, but therapy does not suspect this. Duloxetine  caused jittery feeling and nausea.  Will continue therapy sessions at this time. Consider trial of alternate medication in future. Denies SI/I. ?if fibromyalgia due to multiple symptoms presenting with this.      Obesity   BMI 40.40. Recommended eating smaller high protein, low fat meals more frequently and exercising 30 mins a day 5 times a week with a goal of 10-15lb weight loss in the next 3 months. Patient voiced their understanding and motivation to adhere to these recommendations.       Multiple joint pain - Primary   To multiple areas. ?if fibromyalgia as has headaches, trouble sleeping, stomach issues, fatigue, and mood changes. 8 points positive on exam last visit. Had positive ANA and did see rheumatology, is going to schedule visit with alternate rheumatologist for second assessment.  Could consider trial of low dose Lyrica in future. No family history of lupus. Send in Robaxin  to take PRN for spasms, this may cause less fatigue, and Meloxicam  to take daily as  needed for pain.        Follow up plan: Return in about 2 months (around 02/18/2024) for JOINT PAIN.

## 2023-12-19 NOTE — Assessment & Plan Note (Addendum)
 To multiple areas. ?if fibromyalgia as has headaches, trouble sleeping, stomach issues, fatigue, and mood changes. 8 points positive on exam last visit. Had positive ANA and did see rheumatology, is going to schedule visit with alternate rheumatologist for second assessment.  Could consider trial of low dose Lyrica in future. No family history of lupus. Send in Robaxin  to take PRN for spasms, this may cause less fatigue, and Meloxicam  to take daily as needed for pain.

## 2023-12-19 NOTE — Assessment & Plan Note (Signed)
 BMI 40.40. Recommended eating smaller high protein, low fat meals more frequently and exercising 30 mins a day 5 times a week with a goal of 10-15lb weight loss in the next 3 months. Patient voiced their understanding and motivation to adhere to these recommendations.

## 2023-12-19 NOTE — Assessment & Plan Note (Signed)
 Will continue therapy at this time, would benefit from EMDR.

## 2023-12-25 ENCOUNTER — Other Ambulatory Visit: Payer: Self-pay

## 2023-12-25 MED ORDER — CYANOCOBALAMIN 1000 MCG/ML IJ SOLN
1000.0000 ug | INTRAMUSCULAR | 2 refills | Status: DC
  Filled 2023-12-25: qty 3, 90d supply, fill #0

## 2023-12-26 ENCOUNTER — Ambulatory Visit (INDEPENDENT_AMBULATORY_CARE_PROVIDER_SITE_OTHER): Admitting: Licensed Clinical Social Worker

## 2023-12-26 DIAGNOSIS — F439 Reaction to severe stress, unspecified: Secondary | ICD-10-CM

## 2023-12-26 DIAGNOSIS — F3281 Premenstrual dysphoric disorder: Secondary | ICD-10-CM

## 2023-12-26 NOTE — Progress Notes (Signed)
 THERAPIST PROGRESS NOTE  Session Time: 10:57am-12pm  Participation Level: Active  Behavioral Response: CasualAlertAnxious and Depressed  Type of Therapy: Individual Therapy  Treatment Goals addressed:  Active     BH CCP Acute or Chronic Trauma Reaction     LTG: Elimination of maladaptive behaviors and thinking patterns which interfere with resolution of trauma as evidenced by self report     Start:  11/21/23    Expected End:  02/21/24         LTG: Develop and implement effective coping skills to carry out normal responsibilities and participate constructively in relationships as evidenced by self report     Start:  11/21/23    Expected End:  02/21/24         STG: Daneesha will identify coping strategies to deal with trauma memories and the associated emotional reaction     Start:  11/21/23    Expected End:  02/21/24         Cooperate with trauma-focused psychotherapy techniques to reduce emotional reaction to the traumatic event      Start:  11/21/23         Educate Mardell that exposure to trauma may result in brain and hormonal changes that can lead to difficulties with memory, learning, emotional regulation, poor impulse control, or depression that can persist     Start:  11/21/23         Work with [employer] to construct a list of the situations, people, & places that [facility] evoke the most distressing symptoms; suggest that they keep a journal of instances of stress being triggered     Start:  11/21/23           OP Depression     LTG: Reduce frequency, intensity, and duration of depression symptoms so that daily functioning is improved     Start:  11/21/23    Expected End:  02/21/24         LTG: Increase coping skills to manage depression and improve ability to perform daily activities     Start:  11/21/23    Expected End:  02/21/24         STG: Janaysia will identify cognitive patterns and beliefs that support depression     Start:  11/21/23    Expected  End:  02/21/24         LTG: finding balance AEB less anxiety and depression      Start:  11/21/23    Expected End:  02/21/24         Therapist will educate patient on cognitive distortions and the rationale for treatment of depression     Start:  11/21/23         Nyssa will identify 2 cognitive distortions they are currently using and write reframing statements to replace them     Start:  11/21/23         coping Skills      Start:  11/21/23       Will work with the pt using CBT/DBT techniques to help the pt verbalize an understanding of the cognitive, physiological, and behavioral components of depression and its treatment. This will be done by using worksheets, interactive activities, CBT/ABC thought logs, modeling, homework, role playing and journaling. Will work with pt to learn and implement coping skills that result in a reduction of depression and improve daily functioning per pt self-report 3 out of 5 documented sessions.          ProgressTowards Goals: Progressing  Interventions: Supportive, Reframing, and Other: EMDR  Summary: Raley Novicki is a 23 y.o. female who presents with Symptoms include, but are not limited to, depressed mood, anhedonia, insomnia, fatigue, difficulty concentrating, decreased appetite, mood lability, excessive worry, and occasional visual disturbances, such as seeing movement out of the corner of her eye. Pt was oriented times 5. Pt was cooperative and engaged. Pt denies HI/AVH.    The patient reflected on her grief following the passing of her grandmother the night before her appointment. She reports feeling in a state of shock and is unsure how to process her emotions. The patient described seeing her grandmother's body, which she found traumatic. She also expressed concern for her mother's wellbeing after this recent loss.  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 She noted that her anxiety has worsened and she is worried about the future.  To begin addressing her trauma related to her feelings toward African-American males, the patient developed a target sequence plan to process her experiences using EMDR (Eye Movement Desensitization and Reprocessing). Together, the clinician and the patient reviewed her target sequence plan, focusing on her emotions and memories. This process helped reduce her subjective units of distress from a score of 5 to a score of 0, while instilling the belief that I can choose who I trust.  Suicidal/Homicidal: Nowithout intent/plan  Therapist Response: Clinician utilized active listening to create a safe space for patient to process recent stressors and assess for safety since last session.  Reflected on grief work related to her grandmother's recent death.  Began EMDR by processing patient's touch and memory.  Plan: Return again in 2 weeks.  Diagnosis: PMDD (premenstrual dysphoric disorder)  Trauma and stressor-related disorder   Collaboration of Care: AEB psychiatrist can access notes and cln. Will review psychiatrists' notes. Check in with the patient and will see LCSW per availability. Patient agreed with treatment recommendations.   Patient/Guardian was advised Release of Information must be obtained prior to any record release in order to collaborate their care with an outside provider. Patient/Guardian was advised if they have not already done so to contact the registration department to sign all necessary forms in order for us  to release information regarding their care.   Consent: Patient/Guardian gives verbal consent for treatment and assignment of benefits for services provided during this visit. Patient/Guardian expressed understanding and agreed to proceed.   Evalene KATHEE Husband, LCSW 12/26/2023

## 2023-12-31 NOTE — Telephone Encounter (Signed)
 Can we please see if these are pending with incoming faxes. Thank you

## 2024-01-03 DIAGNOSIS — R058 Other specified cough: Secondary | ICD-10-CM | POA: Diagnosis not present

## 2024-01-03 DIAGNOSIS — M2550 Pain in unspecified joint: Secondary | ICD-10-CM | POA: Diagnosis not present

## 2024-01-03 DIAGNOSIS — R7689 Other specified abnormal immunological findings in serum: Secondary | ICD-10-CM | POA: Diagnosis not present

## 2024-01-03 DIAGNOSIS — M791 Myalgia, unspecified site: Secondary | ICD-10-CM | POA: Diagnosis not present

## 2024-01-03 DIAGNOSIS — J3489 Other specified disorders of nose and nasal sinuses: Secondary | ICD-10-CM | POA: Diagnosis not present

## 2024-01-05 ENCOUNTER — Other Ambulatory Visit: Payer: Self-pay | Admitting: Family Medicine

## 2024-01-06 ENCOUNTER — Other Ambulatory Visit: Payer: Self-pay

## 2024-01-07 ENCOUNTER — Other Ambulatory Visit: Payer: Self-pay

## 2024-01-07 ENCOUNTER — Other Ambulatory Visit: Payer: Self-pay | Admitting: Family Medicine

## 2024-01-07 DIAGNOSIS — R7889 Finding of other specified substances, not normally found in blood: Secondary | ICD-10-CM | POA: Diagnosis not present

## 2024-01-07 DIAGNOSIS — R7689 Other specified abnormal immunological findings in serum: Secondary | ICD-10-CM | POA: Diagnosis not present

## 2024-01-08 ENCOUNTER — Other Ambulatory Visit: Payer: Self-pay | Admitting: Rheumatology

## 2024-01-08 DIAGNOSIS — M2550 Pain in unspecified joint: Secondary | ICD-10-CM

## 2024-01-08 DIAGNOSIS — R9389 Abnormal findings on diagnostic imaging of other specified body structures: Secondary | ICD-10-CM

## 2024-01-09 ENCOUNTER — Inpatient Hospital Stay: Admission: RE | Admit: 2024-01-09 | Discharge: 2024-01-09 | Attending: Rheumatology | Admitting: Rheumatology

## 2024-01-09 ENCOUNTER — Other Ambulatory Visit: Payer: Self-pay

## 2024-01-09 ENCOUNTER — Ambulatory Visit: Admitting: Nurse Practitioner

## 2024-01-09 ENCOUNTER — Encounter: Payer: Self-pay | Admitting: Nurse Practitioner

## 2024-01-09 VITALS — BP 120/72 | HR 80 | Temp 99.5°F | Ht 69.0 in | Wt 272.4 lb

## 2024-01-09 DIAGNOSIS — R9389 Abnormal findings on diagnostic imaging of other specified body structures: Secondary | ICD-10-CM

## 2024-01-09 DIAGNOSIS — J0110 Acute frontal sinusitis, unspecified: Secondary | ICD-10-CM | POA: Diagnosis not present

## 2024-01-09 DIAGNOSIS — M2550 Pain in unspecified joint: Secondary | ICD-10-CM

## 2024-01-09 DIAGNOSIS — J321 Chronic frontal sinusitis: Secondary | ICD-10-CM | POA: Insufficient documentation

## 2024-01-09 MED ORDER — ALBUTEROL SULFATE HFA 108 (90 BASE) MCG/ACT IN AERS
2.0000 | INHALATION_SPRAY | RESPIRATORY_TRACT | 0 refills | Status: AC | PRN
  Filled 2024-01-09: qty 6.7, 25d supply, fill #0

## 2024-01-09 MED ORDER — AZITHROMYCIN 250 MG PO TABS
ORAL_TABLET | ORAL | 0 refills | Status: AC
  Filled 2024-01-09: qty 6, 5d supply, fill #0

## 2024-01-09 MED ORDER — BENZONATATE 100 MG PO CAPS
100.0000 mg | ORAL_CAPSULE | Freq: Two times a day (BID) | ORAL | 0 refills | Status: AC | PRN
  Filled 2024-01-09: qty 40, 20d supply, fill #0

## 2024-01-09 NOTE — Assessment & Plan Note (Signed)
 Acute with symptoms over one week. At this time will start a Zpack and send in Albuterol  inhaler and Tessalon  to use PRN. Recommend: - Increased rest - Increasing Fluids - Acetaminophen / ibuprofen  as needed for fever/pain.  - Salt water gargling, chloraseptic spray and throat lozenges - Mucinex.  - Saline sinus flushes or a neti pot.  - Humidifying the air.

## 2024-01-09 NOTE — Progress Notes (Signed)
 BP 120/72   Pulse 80   Temp 99.5 F (37.5 C) (Oral)   Ht 5' 9 (1.753 m)   Wt 272 lb 6.4 oz (123.6 kg)   LMP 08/27/2023 (Approximate)   SpO2 98%   BMI 40.23 kg/m    Subjective:    Patient ID: Valerie Martinez, female    DOB: 2000/11/10, 23 y.o.   MRN: [id number]  HPI: Valerie Martinez is a 23 y.o. female  Chief Complaint  Patient presents with   URI    Patient states she has been having nasal congestion, cough, sneezing, occasional SOB, and runny nose for the last 2 weeks. States she was taking OTC Dayquil for a week. States she has also noticed color to her phlegm that she cough up and blows out.    UPPER RESPIRATORY TRACT INFECTION Has had symptoms since the 8th, a little over a week. Had recent imaging that report resolution of marked prior pneumonia -- done with rheumatology. Fever: unknown Cough: yes Shortness of breath: yes Wheezing: yes Chest pain: no Chest tightness: yes Chest congestion: yes Nasal congestion: yes Runny nose: yes Post nasal drip: yes Sneezing: yes Sore throat: yes early on Swollen glands: no Sinus pressure: yes Headache: yes Face pain: no Toothache: no Ear pain: none Ear pressure: none Eyes red/itching: yes Eye drainage/crusting: no  Vomiting: nausea Rash: no Fatigue: yes Sick contacts: no Strep contacts: no  Context: stable Recurrent sinusitis: no Relief with OTC cold/cough medications: yes  Treatments attempted: Dayquil  and Tussin pills  Relevant past medical, surgical, family and social history reviewed and updated as indicated. Interim medical history since our last visit reviewed. Allergies and medications reviewed and updated.  Review of Systems  Constitutional:  Positive for fatigue. Negative for activity change, appetite change and fever.  HENT:  Positive for congestion, postnasal drip, rhinorrhea, sinus pressure and sore throat. Negative for ear discharge, ear pain, facial swelling, sinus pain, sneezing and voice  change.   Respiratory:  Positive for cough, chest tightness, shortness of breath and wheezing.   Cardiovascular:  Negative for chest pain, palpitations and leg swelling.  Gastrointestinal: Negative.   Endocrine: Negative.   Neurological:  Negative for dizziness, numbness and headaches.  Psychiatric/Behavioral: Negative.      Per HPI unless specifically indicated above     Objective:    BP 120/72   Pulse 80   Temp 99.5 F (37.5 C) (Oral)   Ht 5' 9 (1.753 m)   Wt 272 lb 6.4 oz (123.6 kg)   LMP 08/27/2023 (Approximate)   SpO2 98%   BMI 40.23 kg/m   Wt Readings from Last 3 Encounters:  01/09/24 272 lb 6.4 oz (123.6 kg)  12/19/23 274 lb (124.3 kg)  11/23/23 272 lb (123.4 kg)    Physical Exam Vitals and nursing note reviewed.  Constitutional:      General: She is awake. She is not in acute distress.    Appearance: She is well-developed and well-groomed. She is obese. She is ill-appearing. She is not toxic-appearing.  HENT:     Head: Normocephalic.     Right Ear: Hearing, ear canal and external ear normal. A middle ear effusion is present. Tympanic membrane is not injected or perforated.     Left Ear: Hearing, ear canal and external ear normal. A middle ear effusion is present. Tympanic membrane is not injected or perforated.     Nose: Rhinorrhea present. Rhinorrhea is clear.     Right Sinus: Frontal sinus tenderness  present. No maxillary sinus tenderness.     Left Sinus: Frontal sinus tenderness present. No maxillary sinus tenderness.     Mouth/Throat:     Mouth: Mucous membranes are moist.     Pharynx: Posterior oropharyngeal erythema present. No pharyngeal swelling or oropharyngeal exudate.  Eyes:     General: Lids are normal.        Right eye: No discharge.        Left eye: No discharge.     Conjunctiva/sclera: Conjunctivae normal.     Pupils: Pupils are equal, round, and reactive to light.  Neck:     Thyroid: No thyromegaly.     Vascular: No carotid bruit.   Cardiovascular:     Rate and Rhythm: Normal rate and regular rhythm.     Heart sounds: Normal heart sounds. No murmur heard.    No gallop.  Pulmonary:     Effort: Pulmonary effort is normal. No accessory muscle usage or respiratory distress.     Breath sounds: Wheezing (scattered throughout) present. No decreased breath sounds or rales.  Abdominal:     General: Bowel sounds are normal.     Palpations: Abdomen is soft. There is no hepatomegaly or splenomegaly.  Musculoskeletal:     Cervical back: Normal range of motion and neck supple.     Right lower leg: No edema.     Left lower leg: No edema.  Lymphadenopathy:     Head:     Right side of head: No submental, submandibular, tonsillar, preauricular or posterior auricular adenopathy.     Left side of head: No submental, submandibular, tonsillar, preauricular or posterior auricular adenopathy.     Cervical: No cervical adenopathy.  Skin:    General: Skin is warm and dry.  Neurological:     Mental Status: She is alert and oriented to person, place, and time.  Psychiatric:        Attention and Perception: Attention normal.        Mood and Affect: Mood normal.        Speech: Speech normal.        Behavior: Behavior normal. Behavior is cooperative.        Thought Content: Thought content normal.     Results for orders placed or performed in visit on 11/23/23  Anti-DNA antibody, double-stranded   Collection Time: 11/23/23 10:55 AM  Result Value Ref Range   dsDNA Ab 1 0 - 9 IU/mL  Lupus Diagnostic Profile   Collection Time: 11/23/23 10:55 AM  Result Value Ref Range   Anti-Nuclear Ab by IFA (RDL) Positive (A) Negative   Anti-dsDNA Ab by Farr(RDL) <8.0 <8.0 IU/mL   Anti-Sm Ab (RDL) <20 <20 Units   Anti-U1 RNP Ab (RDL) 66 (H) <20 Units   Anti-Ro (SS-A) Ab (RDL) <20 <20 Units   Anti-La (SS-B) Ab (RDL) <20 <20 Units   Anti-Chromatin Ab, IgG (RDL) <20 <20 Units   C3 Complement (RDL) 202 (H) 90 - 180 mg/dL   C4 Complement (RDL) 39  10 - 40 mg/dL  CK (Creatine Kinase)   Collection Time: 11/23/23 10:55 AM  Result Value Ref Range   Total CK 169 32 - 182 U/L  HgB A1c   Collection Time: 11/23/23 10:55 AM  Result Value Ref Range   Hgb A1c MFr Bld 5.0 4.8 - 5.6 %   Est. average glucose Bld gHb Est-mCnc 97 mg/dL  Insulin , random   Collection Time: 11/23/23 10:55 AM  Result Value Ref Range   INSULIN   26.5 (H) 2.6 - 24.9 uIU/mL  Vitamin B12   Collection Time: 11/23/23 10:55 AM  Result Value Ref Range   Vitamin B-12 394 232 - 1,245 pg/mL  CBC with Differential/Platelet   Collection Time: 11/23/23 10:55 AM  Result Value Ref Range   WBC 5.5 3.4 - 10.8 x10E3/uL   RBC 4.41 3.77 - 5.28 x10E6/uL   Hemoglobin 13.0 11.1 - 15.9 g/dL   Hematocrit 60.9 65.9 - 46.6 %   MCV 88 79 - 97 fL   MCH 29.5 26.6 - 33.0 pg   MCHC 33.3 31.5 - 35.7 g/dL   RDW 87.0 88.2 - 84.5 %   Platelets 281 150 - 450 x10E3/uL   Neutrophils 56 Not Estab. %   Lymphs 32 Not Estab. %   Monocytes 10 Not Estab. %   Eos 1 Not Estab. %   Basos 1 Not Estab. %   Neutrophils Absolute 3.1 1.4 - 7.0 x10E3/uL   Lymphocytes Absolute 1.8 0.7 - 3.1 x10E3/uL   Monocytes Absolute 0.5 0.1 - 0.9 x10E3/uL   EOS (ABSOLUTE) 0.0 0.0 - 0.4 x10E3/uL   Basophils Absolute 0.0 0.0 - 0.2 x10E3/uL   Immature Granulocytes 0 Not Estab. %   Immature Grans (Abs) 0.0 0.0 - 0.1 x10E3/uL  Comp Met (CMET)   Collection Time: 11/23/23 10:55 AM  Result Value Ref Range   Glucose 107 (H) 70 - 99 mg/dL   BUN 10 6 - 20 mg/dL   Creatinine, Ser 9.05 0.57 - 1.00 mg/dL   eGFR 88 >40 fO/fpw/8.26   BUN/Creatinine Ratio 11 9 - 23   Sodium 139 134 - 144 mmol/L   Potassium 4.3 3.5 - 5.2 mmol/L   Chloride 102 96 - 106 mmol/L   CO2 22 20 - 29 mmol/L   Calcium 9.7 8.7 - 10.2 mg/dL   Total Protein 7.5 6.0 - 8.5 g/dL   Albumin 4.3 4.0 - 5.0 g/dL   Globulin, Total 3.2 1.5 - 4.5 g/dL   Bilirubin Total 0.4 0.0 - 1.2 mg/dL   Alkaline Phosphatase 40 (L) 41 - 116 IU/L   AST 15 0 - 40 IU/L   ALT 14 0  - 32 IU/L  ANA Titer and Pattern   Collection Time: 11/23/23 10:55 AM  Result Value Ref Range   Speckled Pattern 1:160 (H) <1:40   Note: Comment       Assessment & Plan:   Problem List Items Addressed This Visit       Respiratory   Frontal sinusitis - Primary   Acute with symptoms over one week. At this time will start a Zpack and send in Albuterol  inhaler and Tessalon  to use PRN. Recommend: - Increased rest - Increasing Fluids - Acetaminophen / ibuprofen  as needed for fever/pain.  - Salt water gargling, chloraseptic spray and throat lozenges - Mucinex.  - Saline sinus flushes or a neti pot.  - Humidifying the air.       Relevant Medications   azithromycin  (ZITHROMAX ) 250 MG tablet   benzonatate  (TESSALON ) 100 MG capsule     Follow up plan: Return if symptoms worsen or fail to improve.

## 2024-01-09 NOTE — Patient Instructions (Signed)

## 2024-01-10 ENCOUNTER — Ambulatory Visit: Admitting: Licensed Clinical Social Worker

## 2024-01-10 ENCOUNTER — Encounter: Payer: Self-pay | Admitting: Licensed Clinical Social Worker

## 2024-01-10 ENCOUNTER — Other Ambulatory Visit: Payer: Self-pay

## 2024-01-10 DIAGNOSIS — F3281 Premenstrual dysphoric disorder: Secondary | ICD-10-CM

## 2024-01-10 DIAGNOSIS — F439 Reaction to severe stress, unspecified: Secondary | ICD-10-CM | POA: Diagnosis not present

## 2024-01-10 MED FILL — Levocetirizine Dihydrochloride Tab 5 MG: ORAL | 30 days supply | Qty: 30 | Fill #0 | Status: AC

## 2024-01-10 NOTE — Progress Notes (Signed)
 THERAPIST PROGRESS NOTE  Virtual Visit via Video Note  I connected with Sherian LILLETTE Pouch on 01/10/2024 at  9:00 AM EST by a video enabled telemedicine application and verified that I am speaking with the correct person using two identifiers.  Location: Patient: Address on file    Provider: [facility]   I discussed the limitations of evaluation and management by telemedicine and the availability of in person appointments. The patient expressed understanding and agreed to proceed.   I discussed the assessment and treatment plan with the patient. The patient was provided an opportunity to ask questions and all were answered. The patient agreed with the plan and demonstrated an understanding of the instructions.   The patient was advised to call back or seek an in-person evaluation if the symptoms worsen or if the condition fails to improve as anticipated.  I provided 61 minutes of non-face-to-face time during this encounter.   Evalene KATHEE Husband, LCSW    Session Time: 9:01-10:02am  Participation Level: Active  Behavioral Response: CasualAlertEuthymic and flat  Type of Therapy: Individual Therapy  Treatment Goals addressed:  Active     BH CCP Acute or Chronic Trauma Reaction     LTG: Elimination of maladaptive behaviors and thinking patterns which interfere with resolution of trauma as evidenced by self report     Start:  11/21/23    Expected End:  02/21/24         LTG: Develop and implement effective coping skills to carry out normal responsibilities and participate constructively in relationships as evidenced by self report     Start:  11/21/23    Expected End:  02/21/24         STG: Rachel will identify coping strategies to deal with trauma memories and the associated emotional reaction     Start:  11/21/23    Expected End:  02/21/24         Cooperate with trauma-focused psychotherapy techniques to reduce emotional reaction to the traumatic event      Start:   11/21/23         Educate Jahliyah that exposure to trauma may result in brain and hormonal changes that can lead to difficulties with memory, learning, emotional regulation, poor impulse control, or depression that can persist     Start:  11/21/23         Work with [employer] to construct a list of the situations, people, & places that [facility] evoke the most distressing symptoms; suggest that they keep a journal of instances of stress being triggered     Start:  11/21/23           OP Depression     LTG: Reduce frequency, intensity, and duration of depression symptoms so that daily functioning is improved     Start:  11/21/23    Expected End:  02/21/24         LTG: Increase coping skills to manage depression and improve ability to perform daily activities     Start:  11/21/23    Expected End:  02/21/24         STG: Janella will identify cognitive patterns and beliefs that support depression     Start:  11/21/23    Expected End:  02/21/24         LTG: finding balance AEB less anxiety and depression      Start:  11/21/23    Expected End:  02/21/24         Therapist will educate  patient on cognitive distortions and the rationale for treatment of depression     Start:  11/21/23         Malanie will identify 2 cognitive distortions they are currently using and write reframing statements to replace them     Start:  11/21/23         coping Skills      Start:  11/21/23       Will work with the pt using CBT/DBT techniques to help the pt verbalize an understanding of the cognitive, physiological, and behavioral components of depression and its treatment. This will be done by using worksheets, interactive activities, CBT/ABC thought logs, modeling, homework, role playing and journaling. Will work with pt to learn and implement coping skills that result in a reduction of depression and improve daily functioning per pt self-report 3 out of 5 documented sessions.           ProgressTowards Goals: Progressing  Interventions: CBT, Supportive, and Reframing  Summary: Alejandria Wessells is a 23 y.o. female who presents with Symptoms include, but are not limited to, depressed mood, anhedonia, insomnia, fatigue, difficulty concentrating, decreased appetite, mood lability, excessive worry, and occasional visual disturbances, such as seeing movement out of the corner of her eye. Pt was oriented times 5. Pt was cooperative and engaged. Pt denies HI/AVH.    Patient reflected on her grandmother's funeral and how the recent illness influenced her mood. She explored the impact this has had on family dynamics.  The patient shared a graph she constructed to understand her trust zones, triggers, and warning signs for unhealthy relationships. She reflected on her improvements in recognizing thoughts around  trust with men and how she has rationalized future interactions.  She discussed using assertive communication to express her boundaries and made connections to her trauma history, particularly with religious figures.  The patient also reflected on her low self-esteem and self-worth, noting the negative thoughts and feelings in her head. She expressed difficulty breaking the cycle, struggling with the belief that she has no worth, and feeling guilty about her inability to support her mother in their shared household. She feels uncomfortable with her body due to changes in her physique. We worked through CBT reframing exercises to challenge her mind-reading tendencies and the belief that she is a failure.  The patient identified a distressing memory that contributes to her low self-esteem, particularly the negative feedback she received from relatives. The clinician worked with her through EMDR to begin desensitizing this memory, with the patient reporting a subjective unit of distress at a score of 8. However, due to time constraints, she was unable to fully process this memory,  and they will revisit it in the next appointment.  For homework, the patient received several CBT restructuring exercises to challenge her negative belief system surrounding the idea that she is a failure.   Suicidal/Homicidal: Nowithout intent/plan  Therapist Response: Clinician utilized active listening to create a safe space for patient to process recent stressors and assess for safety since last session.  Briefly engaged in CBT work to challenge and reframe patient's negative belief systems related to success and feeling she has a failure.  Reflected on grief work related to her grandmother's recent death.  Began EMDR by processing patient's memories.   Plan: Return again in 2 weeks.   Diagnosis: PMDD (premenstrual dysphoric disorder)   Trauma and stressor-related disorder  Collaboration of Care: Other Meet with LCSW per availability   Patient/Guardian was advised Release of Information  must be obtained prior to any record release in order to collaborate their care with an outside provider. Patient/Guardian was advised if they have not already done so to contact the registration department to sign all necessary forms in order for us  to release information regarding their care.   Consent: Patient/Guardian gives verbal consent for treatment and assignment of benefits for services provided during this visit. Patient/Guardian expressed understanding and agreed to proceed.   Evalene KATHEE Husband, LCSW 01/10/2024

## 2024-01-10 NOTE — Telephone Encounter (Signed)
 Requested medication (s) are due for refill today: na  Requested medication (s) are on the active medication list: yes   Last refill:  07/17/23 #30 5 refills  Future visit scheduled yes 02/19/24  Notes to clinic:  last ordered by Dr. Edman 07/17/23. Do you want to continue refills?     Requested Prescriptions  Pending Prescriptions Disp Refills   levocetirizine (XYZAL ) 5 MG tablet 30 tablet 5    Sig: Take 1 tablet (5 mg total) by mouth every evening.     Ear, Nose, and Throat:  Antihistamines - levocetirizine dihydrochloride  Passed - 01/10/2024  1:17 PM      Passed - Cr in normal range and within 360 days    Creat  Date Value Ref Range Status  11/07/2022 0.81 0.50 - 0.96 mg/dL Final   Creatinine, Ser  Date Value Ref Range Status  11/23/2023 0.94 0.57 - 1.00 mg/dL Final         Passed - eGFR is 10 or above and within 360 days    eGFR  Date Value Ref Range Status  11/23/2023 88 >59 mL/min/1.73 Final         Passed - Valid encounter within last 12 months    Recent Outpatient Visits           Yesterday Acute non-recurrent frontal sinusitis   [place] [facility], Melanie T, NP   3 weeks ago Multiple joint pain   [place] [facility] [place], [place] T, NP   1 month ago Multiple joint pain   [place] [facility] [place], Melanie T, NP   4 months ago Chronic musculoskeletal pain   [facility], MD   4 months ago Chronic musculoskeletal pain   [place] [facility], Jason J, MD

## 2024-01-21 ENCOUNTER — Other Ambulatory Visit: Payer: Self-pay

## 2024-01-30 ENCOUNTER — Ambulatory Visit (INDEPENDENT_AMBULATORY_CARE_PROVIDER_SITE_OTHER): Admitting: Licensed Clinical Social Worker

## 2024-01-30 DIAGNOSIS — F439 Reaction to severe stress, unspecified: Secondary | ICD-10-CM

## 2024-01-30 DIAGNOSIS — F3281 Premenstrual dysphoric disorder: Secondary | ICD-10-CM

## 2024-01-30 NOTE — Progress Notes (Signed)
 "  THERAPIST PROGRESS NOTE  Session Time: 9-10am  Participation Level: Active   Behavioral Response: CasualAlertEuthymic and flat   Type of Therapy: Individual Therapy   Treatment Goals addressed:  Active       BH CCP Acute or Chronic Trauma Reaction       LTG: Elimination of maladaptive behaviors and thinking patterns which interfere with resolution of trauma as evidenced by self report       Start:  11/21/23    Expected End:  02/21/24           LTG: Develop and implement effective coping skills to carry out normal responsibilities and participate constructively in relationships as evidenced by self report       Start:  11/21/23    Expected End:  02/21/24           STG: Norene will identify coping strategies to deal with trauma memories and the associated emotional reaction       Start:  11/21/23    Expected End:  02/21/24           Cooperate with trauma-focused psychotherapy techniques to reduce emotional reaction to the traumatic event        Start:  11/21/23           Educate Tasnia that exposure to trauma may result in brain and hormonal changes that can lead to difficulties with memory, learning, emotional regulation, poor impulse control, or depression that can persist       Start:  11/21/23           Work with [employer] to construct a list of the situations, people, & places that [facility] evoke the most distressing symptoms; suggest that they keep a journal of instances of stress being triggered       Start:  11/21/23             OP Depression       LTG: Reduce frequency, intensity, and duration of depression symptoms so that daily functioning is improved       Start:  11/21/23    Expected End:  02/21/24           LTG: Increase coping skills to manage depression and improve ability to perform daily activities       Start:  11/21/23    Expected End:  02/21/24           STG: Durga will identify cognitive patterns and beliefs that support depression        Start:  11/21/23    Expected End:  02/21/24           LTG: finding balance AEB less anxiety and depression        Start:  11/21/23    Expected End:  02/21/24           Therapist will educate patient on cognitive distortions and the rationale for treatment of depression       Start:  11/21/23           Royelle will identify 2 cognitive distortions they are currently using and write reframing statements to replace them       Start:  11/21/23           coping Skills        Start:  11/21/23       Will work with the pt using CBT/DBT techniques to help the pt verbalize an understanding of the cognitive, physiological, and behavioral components of depression and its treatment.  This will be done by using worksheets, interactive activities, CBT/ABC thought logs, modeling, homework, role playing and journaling. Will work with pt to learn and implement coping skills that result in a reduction of depression and improve daily functioning per pt self-report 3 out of 5 documented sessions.              ProgressTowards Goals: Progressing   Interventions: CBT, Supportive, and Reframing   Summary: Maricella Filyaw is a 24 y.o. female who presents with Symptoms include, but are not limited to, depressed mood, anhedonia, insomnia, fatigue, difficulty concentrating, decreased appetite, mood lability, excessive worry, and occasional visual disturbances, such as seeing movement out of the corner of her eye. Pt was oriented times 5. Pt was cooperative and engaged. Pt denies HI/AVH.    The patient utilized the therapeutic space to process her low self-esteem, indicating that she feels it is contributing to worsening depressive episodes. She experiences feelings of being judged by both herself and others. The clinician worked with the patient to explore her identity and the aspects of herself that exist outside of her roles. The patient reported that she does not feel she has a distinct identity and is  unaware of who she is beyond her role within her church.  The clinician also helped the patient examine her coping strategies for managing depressive episodes. The patient shared that she engages in activities such as taking showers, reading, practicing deep breathing, performing grounding exercises, participating in creative work, and cooking. However, she noted that these strategies are ineffective in eliminating her racing thoughts. To address this, the clinician employed Cognitive Behavioral Therapy (CBT) techniques to help the patient reframe her negative perspectives. Through a values exercise, the patient identified her core values, which include love, family, wisdom, creativity, and safety. Together, the clinician and the patient processed the significance of each value and explored positive affirmations the patient could use as a result of recognizing these values.  The clinician and the patient also problem-solved ways for her to engage in alternative environments outside of her church. We discussed opportunities for volunteering and participating in resources at [employer], such as book clubs. The patient expressed curiosity about these opportunities and identified a sense of hope.  SI: Regarding her suicidal and homicidal thoughts, the patient reported that she does not have intent or a plan. One week ago, she experienced suicidal ideations, questioning what is the point? but denied having a plan or intent. She also mentioned being able to share her thoughts with her mother. The patient believes this episode of suicidal ideation was related to Premenstrual Dysphoric Disorder (PMDD) and may have been triggered by disruptions in taking her birth control as prescribed. The clinician provided information about behavioral health urgent care and [facility] mental health treatment options in case of a crisis, to which the patient agreed.  During this episode, the patient researched the possibility of  taking Lexapro . The clinician and the patient have continued to discuss the appropriateness of starting medication to address her depression.   Therapist Response: Clinician utilized active listening to create a safe space for patient to process recent stressors and assess for safety since last session.  Briefly engaged in CBT work to challenge and reframe patient's negative belief systems related to self esteem. Worked with patient to explore solutions to furthering her socialization in an effort to increase self understanding.    Plan: Return again in 2 weeks.  Patient identified she feels having increased frequent therapeutic sessions would  be more effective with coping with her mental health symptoms.  Clinician directed patient to explore alternative therapist via [email or web address] specifically ones who may be specialized in treating PMDD.Patient agreed to look into these resources.    Diagnosis: PMDD (premenstrual dysphoric disorder)   Trauma and stressor-related disorder   Collaboration of Care: Other Meet with LCSW per availability   Patient/Guardian was advised Release of Information must be obtained prior to any record release in order to collaborate their care with an outside provider. Patient/Guardian was advised if they have not already done so to contact the registration department to sign all necessary forms in order for us  to release information regarding their care.   Consent: Patient/Guardian gives verbal consent for treatment and assignment of benefits for services provided during this visit. Patient/Guardian expressed understanding and agreed to proceed.   Evalene KATHEE Husband, LCSW 01/30/2024  "

## 2024-02-05 ENCOUNTER — Other Ambulatory Visit: Payer: Self-pay

## 2024-02-05 MED ORDER — CELECOXIB 200 MG PO CAPS
200.0000 mg | ORAL_CAPSULE | Freq: Two times a day (BID) | ORAL | 1 refills | Status: AC
  Filled 2024-02-05: qty 180, 90d supply, fill #0

## 2024-02-10 ENCOUNTER — Encounter: Payer: Self-pay | Admitting: Nurse Practitioner

## 2024-02-12 ENCOUNTER — Ambulatory Visit: Admitting: Licensed Clinical Social Worker

## 2024-02-14 ENCOUNTER — Other Ambulatory Visit: Payer: Self-pay

## 2024-02-14 MED ORDER — TRIAMCINOLONE ACETONIDE 0.025 % EX CREA
TOPICAL_CREAM | Freq: Two times a day (BID) | CUTANEOUS | 0 refills | Status: DC
  Filled 2024-02-14: qty 454, 30d supply, fill #0

## 2024-02-14 MED ORDER — HYDROXYZINE HCL 25 MG PO TABS
25.0000 mg | ORAL_TABLET | Freq: Every day | ORAL | 0 refills | Status: DC
  Filled 2024-02-14: qty 90, 90d supply, fill #0

## 2024-02-14 MED ORDER — LEVOCETIRIZINE DIHYDROCHLORIDE 5 MG PO TABS
10.0000 mg | ORAL_TABLET | Freq: Every day | ORAL | 0 refills | Status: DC
  Filled 2024-02-14: qty 180, 90d supply, fill #0

## 2024-02-15 ENCOUNTER — Other Ambulatory Visit: Payer: Self-pay

## 2024-02-16 DIAGNOSIS — E559 Vitamin D deficiency, unspecified: Secondary | ICD-10-CM | POA: Insufficient documentation

## 2024-02-16 NOTE — Patient Instructions (Incomplete)

## 2024-02-17 ENCOUNTER — Other Ambulatory Visit: Payer: Self-pay

## 2024-02-17 MED ORDER — DROSPIRENONE-ETHINYL ESTRADIOL 3-0.02 MG PO TABS
1.0000 | ORAL_TABLET | Freq: Every day | ORAL | 5 refills | Status: AC
  Filled 2024-02-17: qty 84, 63d supply, fill #0

## 2024-02-18 ENCOUNTER — Other Ambulatory Visit: Payer: Self-pay

## 2024-02-19 ENCOUNTER — Ambulatory Visit: Admitting: Nurse Practitioner

## 2024-02-23 NOTE — Patient Instructions (Incomplete)
 Healthy Eating for Adults Healthy eating may help you get and keep a healthy body weight, reduce the risk of chronic disease, and live a long and productive life. It is important to follow a healthy eating pattern. Your nutritional and calorie needs should be met mainly by different nutrient-rich foods. What are tips for following this plan? Reading food labels Read labels and choose the following: Reduced or low sodium products. Juices with 100% fruit juice. Foods with low saturated fats (<3 g per serving) and high polyunsaturated and monounsaturated fats. Foods with whole grains, such as whole wheat, cracked wheat, brown rice, and wild rice. Whole grains that are fortified with folic acid. This is recommended for females who are pregnant or who want to become pregnant. Read labels and do not eat or drink the following: Foods or drinks with added sugars. These include foods that contain brown sugar, corn sweetener, corn syrup, dextrose, fructose, glucose, high-fructose corn syrup, honey, invert sugar, lactose, malt syrup, maltose, molasses, raw sugar, sucrose, trehalose, or turbinado sugar. Limit your intake of added sugars to less than 10% of your total daily calories. Do not eat more than the following amounts of added sugar per day: 6 teaspoons (25 g) for females. 9 teaspoons (38 g) for males. Foods that contain processed or refined starches and grains. Refined grain products, such as white flour, degermed cornmeal, white bread, and white rice. Shopping Choose nutrient-rich snacks, such as vegetables, whole fruits, and nuts. Avoid high-calorie and high-sugar snacks, such as potato chips, fruit snacks, and candy. Use oil-based dressings and spreads on foods instead of solid fats such as butter, margarine, sour cream, or cream cheese. Limit pre-made sauces, mixes, and instant products such as flavored rice, instant noodles, and ready-made pasta. Try more plant-protein sources, such as tofu,  tempeh, black beans, edamame, lentils, nuts, and seeds. Explore eating plans such as the Mediterranean diet or vegetarian diet. Try heart-healthy dips made with beans and healthy fats like hummus and guacamole. Vegetables go great with these. Cooking Use oil to saut or stir-fry foods instead of solid fats such as butter, margarine, or lard. Try baking, boiling, grilling, or broiling instead of frying. Remove the fatty part of meats before cooking. Steam vegetables in water or broth. Meal planning  At meals, imagine dividing your plate into fourths: One-half of your plate is fruits and vegetables. One-fourth of your plate is whole grains. One-fourth of your plate is protein, especially lean meats, poultry, eggs, tofu, beans, or nuts. Include low-fat dairy as part of your daily diet. Lifestyle Choose healthy options in all settings, including home, work, school, restaurants, or stores. Prepare your food safely: Wash your hands after handling raw meats. Where you prepare food, keep surfaces clean by regularly washing with hot, soapy water. Keep raw meats separate from ready-to-eat foods, such as fruits and vegetables. Cook seafood, meat, poultry, and eggs to the recommended temperature. Get a food thermometer. Store foods at safe temperatures. In general: Keep cold foods at 28F (4.4C) or below. Keep hot foods at 128F (60C) or above. Keep your freezer at [facility] (-17.8C) or below. Foods are not safe to eat if they have been between the temperatures of 40-128F (4.4-60C) for more than 2 hours. What foods should I eat? Fruits Aim to eat 1-2 cups of fresh, canned (in natural juice), or frozen fruits each day. One cup of fruit equals 1 small apple, 1 large banana, 8 large strawberries, 1 cup (237 g) canned fruit,  cup (82 g) dried  fruit, or 1 cup (240 mL) 100% juice. Vegetables Aim to eat 2-4 cups of fresh and frozen vegetables each day, including different varieties and colors. One cup  of vegetables equals 1 cup (91 g) broccoli or cauliflower florets, 2 medium carrots, 2 cups (150 g) raw, leafy greens, 1 large tomato, 1 large bell pepper, 1 large sweet potato, or 1 medium white potato. Grains Aim to eat 5-10 ounce-equivalents of whole grains each day. Examples of 1 ounce-equivalent of grains include 1 slice of bread, 1 cup (40 g) ready-to-eat cereal, 3 cups (24 g) popcorn, or  cup (93 g) cooked rice. Meats and other proteins Try to eat 5-7 ounce-equivalents of protein each day. Examples of 1 ounce-equivalent of protein include 1 egg,  oz nuts (12 almonds, 24 pistachios, or 7 walnut halves), 1/4 cup (90 g) cooked beans, 6 tablespoons (90 g) hummus or 1 tablespoon (16 g) peanut butter. A cut of meat or fish that is the size of a deck of cards is about 3-4 ounce-equivalents (85 g). Of the protein you eat each week, try to have at least 8 sounce (227 g) of seafood. This is about 2 servings per week. This includes salmon, trout, herring, sardines, and anchovies. Dairy Aim to eat 3 cup-equivalents of fat-free or low-fat dairy each day. Examples of 1 cup-equivalent of dairy include 1 cup (240 mL) milk, 8 ounces (250 g) yogurt, 1 ounces (44 g) natural cheese, or 1 cup (240 mL) fortified soy milk. Fats and oils Aim for about 5 teaspoons (21 g) of fats and oils per day. Choose monounsaturated fats, such as canola and olive oils, mayonnaise made with olive oil or avocado oil, avocados, peanut butter, and most nuts, or polyunsaturated fats, such as sunflower, corn, and soybean oils, walnuts, pine nuts, sesame seeds, sunflower seeds, and flaxseed. Beverages Aim for 6 eight-ounce glasses of water per day. Limit coffee to 3-5 eight-ounce cups per day. Limit caffeinated beverages that have added calories, such as soda and energy drinks. If you drink alcohol: Limit how much you have to: 0-1 drink a day if you are female. 0-2 drinks a day if you are female. Know how much alcohol is in your drink.  In the [place] one drink is one 12 oz bottle of beer (355 mL), one 5 oz glass of wine (148 mL), or one 1 oz glass of hard liquor (44 mL). Seasoning and other foods Try not to add too much salt to your food. Try using herbs and spices instead of salt. Try not to add sugar to food. This information is based on [place] nutrition guidelines. To learn more, visit disposablenylon.be. Exact amounts may vary. You may need different amounts. This information is not intended to replace advice given to you by your health care provider. Make sure you discuss any questions you have with your health care provider. Document Revised: 11/19/2023 Document Reviewed: 10/10/2021 Elsevier Patient Education  2025 [employer].

## 2024-02-26 ENCOUNTER — Ambulatory Visit: Admitting: Licensed Clinical Social Worker

## 2024-02-26 ENCOUNTER — Ambulatory Visit: Admitting: Nurse Practitioner

## 2024-02-29 ENCOUNTER — Other Ambulatory Visit: Payer: Self-pay | Admitting: Rheumatology

## 2024-02-29 ENCOUNTER — Ambulatory Visit

## 2024-02-29 ENCOUNTER — Ambulatory Visit: Admitting: Nurse Practitioner

## 2024-02-29 ENCOUNTER — Encounter: Payer: Self-pay | Admitting: Nurse Practitioner

## 2024-02-29 VITALS — BP 114/73 | HR 74 | Temp 98.7°F | Ht 69.0 in | Wt 244.6 lb

## 2024-02-29 DIAGNOSIS — Z6839 Body mass index (BMI) 39.0-39.9, adult: Secondary | ICD-10-CM

## 2024-02-29 DIAGNOSIS — M2550 Pain in unspecified joint: Secondary | ICD-10-CM

## 2024-02-29 DIAGNOSIS — E66812 Obesity, class 2: Secondary | ICD-10-CM

## 2024-02-29 DIAGNOSIS — R9389 Abnormal findings on diagnostic imaging of other specified body structures: Secondary | ICD-10-CM

## 2024-02-29 DIAGNOSIS — F3281 Premenstrual dysphoric disorder: Secondary | ICD-10-CM

## 2024-02-29 DIAGNOSIS — R002 Palpitations: Secondary | ICD-10-CM | POA: Insufficient documentation

## 2024-02-29 DIAGNOSIS — E88819 Insulin resistance, unspecified: Secondary | ICD-10-CM

## 2024-02-29 DIAGNOSIS — R5382 Chronic fatigue, unspecified: Secondary | ICD-10-CM | POA: Insufficient documentation

## 2024-02-29 DIAGNOSIS — E6609 Other obesity due to excess calories: Secondary | ICD-10-CM

## 2024-02-29 DIAGNOSIS — E010 Iodine-deficiency related diffuse (endemic) goiter: Secondary | ICD-10-CM

## 2024-02-29 DIAGNOSIS — Z8349 Family history of other endocrine, nutritional and metabolic diseases: Secondary | ICD-10-CM | POA: Insufficient documentation

## 2024-02-29 NOTE — Patient Instructions (Signed)
 Joint Pain: What It Means  Joint pain can be caused by many things. It may go away if you follow instructions from your health care provider for taking care of yourself at home. Sometimes, you may need more treatment. Joint pain can be caused by: Bruises at the area of the joint. An injury caused by movements that are repeated. Wear and tear on the joint as you get older. Buildup of uric acid crystals in the joint. This is also called gout. Irritation and swelling of the joint. Types of arthritis. Infections of the joint or of the bone. Your provider may tell you to take pain medicine or wear an elastic bandage, sling, or splint. If your joint pain continues, you may need lab or imaging tests to find the cause of your joint pain. Follow these instructions at home: If you have an elastic bandage, sling, or splint that can be taken off: Wear the bandage, sling, or splint as told by your provider. Take it off only if your provider says you can. Check the skin under and around it every day. Tell your provider if you see problems. Loosen it if your fingers or toes tingle, are numb, or turn cold and blue. Keep it clean and dry. Ask your provider if you should remove it before bathing. If the bandage, sling, or splint is not waterproof: Do not let it get wet. Cover it when you take a bath or shower. Use a cover that does not let any water in. Managing pain, stiffness, and swelling     If told, put ice on the area. If you have an elastic bandage, sling, or splint that you can take off, remove it as told. Put ice in a plastic bag. Place a towel between your skin and the bag. Leave the ice on for 20 minutes, 2-3 times a day. If told, put heat on the area. Do this as often as told. Use the heat source that your provider recommends, such as a moist heat pack or a heating pad. Place a towel between your skin and the heat source. Leave the heat on for 20-30 minutes. If your skin turns bright red,  take off the ice or heat right away to prevent skin damage. The risk of damage is higher if you can't feel pain, heat, or cold. Move your fingers or toes often to reduce stiffness and swelling. Raise the injured area above the level of your heart while you're sitting or lying down. Use a pillow to support the painful area as needed. Activity Rest the painful joint as told. Do not do things that cause pain or make pain worse. Begin exercising or stretching the affected area as told by your provider. Return to normal activities when you are told. Ask what things are safe for you to do. General instructions Take your medicines as told by your provider. Treatment may include medicines for pain and swelling that are taken by mouth or applied to the skin. Do not smoke, vape, or use products with nicotine or tobacco in them. If you need help quitting, talk with your provider. Keep all follow-up visits. Your provider will want to check on your condition. Contact a health care provider if: You have pain that does not get better with medicine. Your joint pain does not improve within 3 days. You have more bruising or swelling. You have a fever. You lose 10 lb (4.5 kg) or more without trying. Get help right away if: You cannot move the  joint. Your fingers or toes tingle, become numb, or turn cold and blue. You have a fever along with a joint that's red, warm, and swollen. This information is not intended to replace advice given to you by your health care provider. Make sure you discuss any questions you have with your health care provider. Document Revised: 11/20/2023 Document Reviewed: 03/24/2022 Elsevier Patient Education  2025 [employer].

## 2024-02-29 NOTE — Assessment & Plan Note (Signed)
 Family member with pituitary adenoma and mother/her are concerned about pituitary issues with her current symptoms. Will check labs today and consider imaging as needed dependent on labs.

## 2024-02-29 NOTE — Progress Notes (Signed)
 "  BP 114/73   Pulse 74   Temp 98.7 F (37.1 C) (Oral)   Ht 5' 9 (1.753 m)   Wt 244 lb 9.6 oz (110.9 kg)   LMP 08/29/2023 (Approximate)   SpO2 98%   BMI 36.12 kg/m    Subjective:    Patient ID: Valerie Martinez, female    DOB: [birth year], 24 y.o.   MRN: [id number]  HPI: Valerie Martinez is a 24 y.o. female  Chief Complaint  Patient presents with   Joint Pain    2 month f/up   Mother at bedside with patient.  FATIGUE Ongoing issues with fatigue. Had episode of feeling heart beat funny last night, put pulse ox on her finger last night and she would notice extra beats. Has noticed these episodes before, especially if takes sedative stuff. Her mother reports at times when patient wakes up she will be gasping for air. They were unsure if she was dehydrated last night. Has never had a sleep study. Her mother reports she has episodes of jittery and difficulty getting words out, once she eats something then feels nauseous but then symptoms improve. Duration:  chronic Severity: 6/10  Onset: gradual Context when symptoms started:  unknown Symptoms improve with rest: no  Depressive symptoms: has her moments Stress/anxiety: yes due to joint pains Insomnia: yes hard to stay asleep Snoring: yes Observed apnea by bed partner: yes Daytime hypersomnolence:no Wakes feeling refreshed: no History of sleep study: no Dysnea on exertion:  no Orthopnea/PND: no Chest pain: no Chronic cough: no Lower extremity edema: no Arthralgias:yes Myalgias: no Weakness: yes Rash: no   ARTHRALGIAS / JOINT ACHES Following up today for joint pain issues which are ongoing. Has had positive ANA. 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 Is scheduled to see neurology in March. Her mother and her are concerned for pituitary issues, as family member has pituitary adenoma and Graves  disease. Allergist recently ran labs, was told thyroid  labs were off. Reports having issues with digestive system, gets really intense cramps with certain foods - randomly happens and unsure which foods. Will get diarrhea with the cramping, this is not as often now that she has eliminated dairy.   Pain to joints is continuing to be similar, more consistent now than intermittent. Cannot take muscle relaxer and Benadryl due to it making her sleepy. Flexeril and Baclofent caused fatigue too. She is to have MRI pelvic area but gets severely claustrophobic and even with oral benzo, this made her more wired though. They had discussed IV sedation but have been unable to get in touch with specialist to ask.   HISTORY: Symptoms since November 2024: lots of pain/spasms which traveled to joints (it would vary as to joints), stomach issues, if does not eat every 2 hours gets shaky, trouble sleeping (sleep paralysis episodes), sporadic headaches, increase fatigue, occasional joint swelling. No fevers. Occasional rash under breast area. No hair loss or ulcers in mouth.  Duration: months Pain: yes Symmetric: yes  10/10 at worst, 3/10 at present Quality: dull, aching, and throbbing Frequency: more consistent Context: fluctuating Decreased function/range of motion: no Erythema: no Swelling: occasional Heat or warmth: sometimes Morning stiffness: yes - this will last for 15 minutes or more Aggravating factors: constant movement Alleviating factors: Tylenol and Aleeve Relief with NSAIDs?: no Treatments attempted: heat, Aleeve, Tylenol  Involved Joints:     Hands: none  Wrists: yes bilateral R>L    Elbows: no    Shoulders: yes bilateral    Back: yes lower back    Hips: yes bilateral    Knees: yes bilateral    Ankles: yes bilateral     02/29/2024    8:18 AM 01/09/2024    3:32 PM 12/19/2023   11:16 AM 11/23/2023    9:09 AM 11/21/2023    1:01 PM  Depression screen PHQ 2/9  Decreased Interest 1 0 1 2    Down, Depressed, Hopeless 1 0 0 1   PHQ - 2 Score 2 0 1 3   Altered sleeping 2 0 2 3   Tired, decreased energy 3 0 1 2   Change in appetite 2 0 1 1   Feeling bad or failure about yourself  2 0 1 0   Trouble concentrating 1 0 0 1   Moving slowly or fidgety/restless 0 0 0 1   Suicidal thoughts 0 0 0 0   PHQ-9 Score 12 0 6 11    Difficult doing work/chores Somewhat difficult Very difficult Somewhat difficult Somewhat difficult      Information is confidential and restricted. Go to Review Flowsheets to unlock data.   Data saved with a previous flowsheet row definition       02/29/2024    8:18 AM 01/09/2024    3:33 PM 12/19/2023   11:16 AM 11/23/2023    9:10 AM  GAD 7 : Generalized Anxiety Score  Nervous, Anxious, on Edge 2 1  0  0   Control/stop worrying 2 1  0  0   Worry too much - different things 2 1  1   0   Trouble relaxing 2 1  1  2    Restless 2 1  0  2   Easily annoyed or irritable 2 1  2  1    Afraid - awful might happen 1 1  1   0   Total GAD 7 Score 13 7 5 5   Anxiety Difficulty Somewhat difficult Very difficult  Somewhat difficult     Data saved with a previous flowsheet row definition   Relevant past medical, surgical, family and social history reviewed and updated as indicated. Interim medical history since our last visit reviewed. Allergies and medications reviewed and updated.  Review of Systems  Constitutional:  Positive for fatigue. Negative for activity change, appetite change, chills and fever.  Respiratory:  Negative for cough, chest tightness, shortness of breath and wheezing.   Cardiovascular:  Positive for palpitations. Negative for chest pain and leg swelling.  Gastrointestinal:  Positive for diarrhea.  Endocrine: Negative for cold intolerance and heat intolerance.  Musculoskeletal:  Positive for arthralgias and joint swelling.  Neurological: Negative.   Psychiatric/Behavioral:  Positive for sleep disturbance. Negative for decreased concentration,  self-injury and suicidal ideas. The patient is not nervous/anxious.    Per HPI unless specifically indicated above     Objective:    BP 114/73   Pulse 74   Temp 98.7 F (37.1 C) (Oral)   Ht 5' 9 (1.753 m)   Wt 244 lb 9.6 oz (110.9 kg)   LMP 08/29/2023 (Approximate)   SpO2 98%   BMI 36.12 kg/m   Wt Readings from Last 3 Encounters:  02/29/24 244 lb 9.6 oz (110.9 kg)  01/09/24 272 lb 6.4 oz (123.6 kg)  12/19/23 274 lb (124.3 kg)    Physical Exam Vitals and nursing note reviewed.  Constitutional:      General: She  is awake. She is not in acute distress.    Appearance: She is well-developed and well-groomed. She is obese. She is not ill-appearing or toxic-appearing.  HENT:     Head: Normocephalic.     Right Ear: Hearing and external ear normal.     Left Ear: Hearing and external ear normal.  Eyes:     General: Lids are normal.        Right eye: No discharge.        Left eye: No discharge.     Conjunctiva/sclera: Conjunctivae normal.     Pupils: Pupils are equal, round, and reactive to light.  Neck:     Thyroid : Thyromegaly (mild noted on exam) present.     Vascular: No carotid bruit.  Cardiovascular:     Rate and Rhythm: Normal rate and regular rhythm.     Heart sounds: Normal heart sounds. No murmur heard.    No gallop.     Comments: No extra heart sounds noted. Rate in 70's. Pulmonary:     Effort: Pulmonary effort is normal. No accessory muscle usage or respiratory distress.     Breath sounds: Normal breath sounds. No decreased breath sounds, wheezing or rales.  Abdominal:     General: Bowel sounds are normal. There is no distension.     Palpations: Abdomen is soft.     Tenderness: There is no abdominal tenderness.  Musculoskeletal:     Cervical back: Normal range of motion and neck supple.     Right lower leg: No edema.     Left lower leg: No edema.  Lymphadenopathy:     Cervical: No cervical adenopathy.  Skin:    General: Skin is warm and dry.   Neurological:     Mental Status: She is alert and oriented to person, place, and time.     Deep Tendon Reflexes: Reflexes are normal and symmetric.     Reflex Scores:      Brachioradialis reflexes are 2+ on the right side and 2+ on the left side.      Patellar reflexes are 2+ on the right side and 2+ on the left side. Psychiatric:        Attention and Perception: Attention normal.        Mood and Affect: Mood normal.        Speech: Speech normal.        Behavior: Behavior normal. Behavior is cooperative.        Thought Content: Thought content normal.    Results for orders placed or performed in visit on 11/23/23  Anti-DNA antibody, double-stranded   Collection Time: 11/23/23 10:55 AM  Result Value Ref Range   dsDNA Ab 1 0 - 9 IU/mL  Lupus Diagnostic Profile   Collection Time: 11/23/23 10:55 AM  Result Value Ref Range   Anti-Nuclear Ab by IFA (RDL) Positive (A) Negative   Anti-dsDNA Ab by Farr(RDL) <8.0 <8.0 IU/mL   Anti-Sm Ab (RDL) <20 <20 Units   Anti-U1 RNP Ab (RDL) 66 (H) <20 Units   Anti-Ro (SS-A) Ab (RDL) <20 <20 Units   Anti-La (SS-B) Ab (RDL) <20 <20 Units   Anti-Chromatin Ab, IgG (RDL) <20 <20 Units   C3 Complement (RDL) 202 (H) 90 - 180 mg/dL   C4 Complement (RDL) 39 10 - 40 mg/dL  CK (Creatine Kinase)   Collection Time: 11/23/23 10:55 AM  Result Value Ref Range   Total CK 169 32 - 182 U/L  HgB A1c   Collection Time: 11/23/23  10:55 AM  Result Value Ref Range   Hgb A1c MFr Bld 5.0 4.8 - 5.6 %   Est. average glucose Bld gHb Est-mCnc 97 mg/dL  Insulin , random   Collection Time: 11/23/23 10:55 AM  Result Value Ref Range   INSULIN  26.5 (H) 2.6 - 24.9 uIU/mL  Vitamin B12   Collection Time: 11/23/23 10:55 AM  Result Value Ref Range   Vitamin B-12 394 232 - 1,245 pg/mL  CBC with Differential/Platelet   Collection Time: 11/23/23 10:55 AM  Result Value Ref Range   WBC 5.5 3.4 - 10.8 x10E3/uL   RBC 4.41 3.77 - 5.28 x10E6/uL   Hemoglobin 13.0 11.1 - 15.9 g/dL    Hematocrit 60.9 65.9 - 46.6 %   MCV 88 79 - 97 fL   MCH 29.5 26.6 - 33.0 pg   MCHC 33.3 31.5 - 35.7 g/dL   RDW 87.0 88.2 - 84.5 %   Platelets 281 150 - 450 x10E3/uL   Neutrophils 56 Not Estab. %   Lymphs 32 Not Estab. %   Monocytes 10 Not Estab. %   Eos 1 Not Estab. %   Basos 1 Not Estab. %   Neutrophils Absolute 3.1 1.4 - 7.0 x10E3/uL   Lymphocytes Absolute 1.8 0.7 - 3.1 x10E3/uL   Monocytes Absolute 0.5 0.1 - 0.9 x10E3/uL   EOS (ABSOLUTE) 0.0 0.0 - 0.4 x10E3/uL   Basophils Absolute 0.0 0.0 - 0.2 x10E3/uL   Immature Granulocytes 0 Not Estab. %   Immature Grans (Abs) 0.0 0.0 - 0.1 x10E3/uL  Comp Met (CMET)   Collection Time: 11/23/23 10:55 AM  Result Value Ref Range   Glucose 107 (H) 70 - 99 mg/dL   BUN 10 6 - 20 mg/dL   Creatinine, Ser 9.05 0.57 - 1.00 mg/dL   eGFR 88 >40 fO/fpw/8.26   BUN/Creatinine Ratio 11 9 - 23   Sodium 139 134 - 144 mmol/L   Potassium 4.3 3.5 - 5.2 mmol/L   Chloride 102 96 - 106 mmol/L   CO2 22 20 - 29 mmol/L   Calcium 9.7 8.7 - 10.2 mg/dL   Total Protein 7.5 6.0 - 8.5 g/dL   Albumin 4.3 4.0 - 5.0 g/dL   Globulin, Total 3.2 1.5 - 4.5 g/dL   Bilirubin Total 0.4 0.0 - 1.2 mg/dL   Alkaline Phosphatase 40 (L) 41 - 116 IU/L   AST 15 0 - 40 IU/L   ALT 14 0 - 32 IU/L  ANA Titer and Pattern   Collection Time: 11/23/23 10:55 AM  Result Value Ref Range   Speckled Pattern 1:160 (H) <1:40   Note: Comment       Assessment & Plan:   Problem List Items Addressed This Visit       Endocrine   Insulin  resistance - Primary   Noted on past labs, elevation in insulin  level. A1c in October stable, will recheck today due to her occasional spells of jitteriness which improve with eating. Family history of diabetes. Recommend frequent, small, healthy meals at home.      Relevant Orders   Prolactin   Testosterone   FSH/LH   Growth hormone   HgB A1c     Other   PMDD (premenstrual dysphoric disorder)   Chronic, ongoing.  Diagnosed by GYN. Psychiatry felt  she may have Bipolar, but therapy does not suspect this. Duloxetine  caused jittery feeling and nausea.  Will continue therapy sessions at this time. Consider trial of alternate medication in future. Denies SI/I. ?if fibromyalgia due to  multiple symptoms presenting with this.      Palpitations   History of similar with reassuring Zio in 2022. Will repeat this outpatient to further assess due to recent return of symptoms. No red flags. Heart rate regular on exam today.      Relevant Orders   LONG TERM MONITOR XT (3-14 DAYS)   Obesity   BMI 36.12. Almost 30 lbs weight loss present, checking labs today. Recommended eating smaller high protein, low fat meals more frequently and exercising 30 mins a day 5 times a week with a goal of 10-15lb weight loss in the next 3 months. Patient voiced their understanding and motivation to adhere to these recommendations.       Multiple joint pain   To multiple areas. ?if fibromyalgia as has headaches, trouble sleeping, stomach issues, fatigue, and mood changes. 8 points positive on exam at initial visit. Had positive ANA and has seen rheumatology, awaiting MRI to be performed but were unable to obtain due to extreme anxiety even with benzo on board - will reach out to Dr. Ebbie to see if he is able to order IV sedation as imaging team recommended to family.  Could consider trial of low dose Lyrica in future, however may be too sedating for her. No family history of lupus. Continue Robaxin  to take PRN for spasms, this may cause less fatigue, and Meloxicam  to take daily as needed for pain. Continue to collaborate with rheumatology, appreciate their input.      Relevant Orders   Basic metabolic panel with GFR   Alpha-Gal Panel   Family history of pituitary disease   Family member with pituitary adenoma and mother/her are concerned about pituitary issues with her current symptoms. Will check labs today and consider imaging as needed dependent on labs.       Relevant Orders   Cortisol   Prolactin   Testosterone   FSH/LH   Growth hormone   Family history of Graves' disease   Family members with this history and thyromegaly noted on exam + thyroid  labs with allergist recently reported to be abnormal. Check labs today and will obtain thyroid  ultrasound to further assess - has multiple symptoms that could be consistent with thyroid  disease.      Relevant Orders   TSH   Thyroid  peroxidase antibody   T4, free   US  THYROID    Chronic fatigue   Ongoing, along with chronic joint pains and other symptoms. Will order sleep study due to witnessed apneic episode by mother. May need CPAP in future. Continue to collaborate with rheumatology. Will run further labs today due to ongoing symptoms: joint pain, chronic fatigue, bowel issues, joint swelling, mood changes.      Relevant Orders   Ambulatory referral to Sleep Studies   US  THYROID    Other Visit Diagnoses       Thyromegaly       Thyroid  ultrasound ordered and labs performed today, recent labs with allergist reported to be abnormal per family.   Relevant Orders   US  THYROID        I personally spent a total of 25 minutes in the care of the patient today including preparing to see the patient, getting/reviewing separately obtained history, performing a medically appropriate exam/evaluation, counseling and educating, placing orders, and coordinating care.   Follow up plan: Return in about 6 weeks (around 04/11/2024) for FATIGUE AND JOINT PAIN.      "

## 2024-02-29 NOTE — Assessment & Plan Note (Signed)
 Chronic, ongoing.  Diagnosed by GYN. Psychiatry felt she may have Bipolar, but therapy does not suspect this. Duloxetine  caused jittery feeling and nausea.  Will continue therapy sessions at this time. Consider trial of alternate medication in future. Denies SI/I. ?if fibromyalgia due to multiple symptoms presenting with this.

## 2024-02-29 NOTE — Assessment & Plan Note (Signed)
 Ongoing, along with chronic joint pains and other symptoms. Will order sleep study due to witnessed apneic episode by mother. May need CPAP in future. Continue to collaborate with rheumatology. Will run further labs today due to ongoing symptoms: joint pain, chronic fatigue, bowel issues, joint swelling, mood changes.

## 2024-02-29 NOTE — Assessment & Plan Note (Signed)
 To multiple areas. ?if fibromyalgia as has headaches, trouble sleeping, stomach issues, fatigue, and mood changes. 8 points positive on exam at initial visit. Had positive ANA and has seen rheumatology, awaiting MRI to be performed but were unable to obtain due to extreme anxiety even with benzo on board - will reach out to Dr. Ebbie to see if he is able to order IV sedation as imaging team recommended to family.  Could consider trial of low dose Lyrica in future, however may be too sedating for her. No family history of lupus. Continue Robaxin  to take PRN for spasms, this may cause less fatigue, and Meloxicam  to take daily as needed for pain. Continue to collaborate with rheumatology, appreciate their input.

## 2024-02-29 NOTE — Assessment & Plan Note (Signed)
 Family members with this history and thyromegaly noted on exam + thyroid  labs with allergist recently reported to be abnormal. Check labs today and will obtain thyroid  ultrasound to further assess - has multiple symptoms that could be consistent with thyroid  disease.

## 2024-02-29 NOTE — Assessment & Plan Note (Signed)
 Noted on past labs, elevation in insulin  level. A1c in October stable, will recheck today due to her occasional spells of jitteriness which improve with eating. Family history of diabetes. Recommend frequent, small, healthy meals at home.

## 2024-02-29 NOTE — Assessment & Plan Note (Signed)
 BMI 36.12. Almost 30 lbs weight loss present, checking labs today. Recommended eating smaller high protein, low fat meals more frequently and exercising 30 mins a day 5 times a week with a goal of 10-15lb weight loss in the next 3 months. Patient voiced their understanding and motivation to adhere to these recommendations.

## 2024-02-29 NOTE — Assessment & Plan Note (Signed)
 History of similar with reassuring Zio in 2022. Will repeat this outpatient to further assess due to recent return of symptoms. No red flags. Heart rate regular on exam today.

## 2024-03-07 ENCOUNTER — Ambulatory Visit

## 2024-04-07 ENCOUNTER — Ambulatory Visit: Admitting: Nurse Practitioner

## 2024-05-07 ENCOUNTER — Ambulatory Visit: Admitting: Nurse Practitioner
